# Patient Record
Sex: Female | Born: 1985 | Race: Black or African American | Hispanic: No | Smoking: Never smoker
Health system: Southern US, Community
[De-identification: ages and names within clinical notes are randomized; demographics above are authoritative.]

## PROBLEM LIST (undated history)

## (undated) DIAGNOSIS — D649 Anemia, unspecified: Secondary | ICD-10-CM

## (undated) DIAGNOSIS — J45909 Unspecified asthma, uncomplicated: Secondary | ICD-10-CM

## (undated) DIAGNOSIS — E559 Vitamin D deficiency, unspecified: Secondary | ICD-10-CM

## (undated) DIAGNOSIS — I10 Essential (primary) hypertension: Secondary | ICD-10-CM

## (undated) DIAGNOSIS — F32A Depression, unspecified: Secondary | ICD-10-CM

## (undated) HISTORY — PX: TUBAL LIGATION: SHX77

## (undated) HISTORY — PX: CHOLECYSTECTOMY: SHX55

## (undated) HISTORY — DX: Essential (primary) hypertension: I10

## (undated) HISTORY — DX: Anemia, unspecified: D64.9

## (undated) HISTORY — DX: Depression, unspecified: F32.A

## (undated) HISTORY — DX: Vitamin D deficiency, unspecified: E55.9

---

## 2018-01-14 ENCOUNTER — Other Ambulatory Visit: Payer: Self-pay

## 2018-01-14 ENCOUNTER — Encounter (HOSPITAL_COMMUNITY): Payer: Self-pay

## 2018-01-14 ENCOUNTER — Emergency Department (HOSPITAL_COMMUNITY)
Admission: EM | Admit: 2018-01-14 | Discharge: 2018-01-14 | Disposition: A | Discharge status: Home / Self Care | Payer: BLUE CROSS/BLUE SHIELD | Attending: Emergency Medicine | Admitting: Emergency Medicine

## 2018-01-14 DIAGNOSIS — T754XXA Electrocution, initial encounter: Secondary | ICD-10-CM | POA: Insufficient documentation

## 2018-01-14 DIAGNOSIS — J45909 Unspecified asthma, uncomplicated: Secondary | ICD-10-CM | POA: Diagnosis not present

## 2018-01-14 HISTORY — DX: Unspecified asthma, uncomplicated: J45.909

## 2018-01-14 LAB — CBC WITH DIFFERENTIAL/PLATELET
BASOS ABS: 0 10*3/uL (ref 0.0–0.1)
Basophils Relative: 0 %
EOS ABS: 0.2 10*3/uL (ref 0.0–0.7)
EOS PCT: 2 %
HCT: 36.7 % (ref 36.0–46.0)
Hemoglobin: 11.7 g/dL — ABNORMAL LOW (ref 12.0–15.0)
LYMPHS PCT: 25 %
Lymphs Abs: 2.3 10*3/uL (ref 0.7–4.0)
MCH: 26.8 pg (ref 26.0–34.0)
MCHC: 31.9 g/dL (ref 30.0–36.0)
MCV: 84 fL (ref 78.0–100.0)
MONO ABS: 0.5 10*3/uL (ref 0.1–1.0)
Monocytes Relative: 5 %
Neutro Abs: 6.2 10*3/uL (ref 1.7–7.7)
Neutrophils Relative %: 68 %
Platelets: 254 10*3/uL (ref 150–400)
RBC: 4.37 MIL/uL (ref 3.87–5.11)
RDW: 13.2 % (ref 11.5–15.5)
WBC: 9.2 10*3/uL (ref 4.0–10.5)

## 2018-01-14 LAB — COMPREHENSIVE METABOLIC PANEL
ALT: 19 U/L (ref 14–54)
AST: 21 U/L (ref 15–41)
Albumin: 3.6 g/dL (ref 3.5–5.0)
Alkaline Phosphatase: 74 U/L (ref 38–126)
Anion gap: 11 (ref 5–15)
BILIRUBIN TOTAL: 1 mg/dL (ref 0.3–1.2)
BUN: 10 mg/dL (ref 6–20)
CO2: 20 mmol/L — ABNORMAL LOW (ref 22–32)
CREATININE: 0.67 mg/dL (ref 0.44–1.00)
Calcium: 8.8 mg/dL — ABNORMAL LOW (ref 8.9–10.3)
Chloride: 105 mmol/L (ref 101–111)
GFR calc Af Amer: >60 mL/min (ref >60)
GLUCOSE: 194 mg/dL — AB (ref 65–99)
Potassium: 3.5 mmol/L (ref 3.5–5.1)
SODIUM: 136 mmol/L (ref 135–145)
TOTAL PROTEIN: 7.2 g/dL (ref 6.5–8.1)

## 2018-01-14 LAB — CK: CK TOTAL: 160 U/L (ref 38–234)

## 2018-01-14 LAB — I-STAT BETA HCG BLOOD, ED (MC, WL, AP ONLY)
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
## 2018-01-14 NOTE — Discharge Instructions (Addendum)
Please read attached information. If you experience any new or worsening signs or symptoms please return to the emergency room for evaluation. Please follow-up with your primary care provider or specialist as discussed.  °
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
## 2018-01-14 NOTE — ED Notes (Signed)
ED Provider at bedside. 

## 2018-03-18 ENCOUNTER — Emergency Department (HOSPITAL_COMMUNITY): Payer: BLUE CROSS/BLUE SHIELD

## 2018-03-18 ENCOUNTER — Emergency Department (HOSPITAL_COMMUNITY)
Admission: EM | Admit: 2018-03-18 | Discharge: 2018-03-18 | Disposition: A | Discharge status: Home / Self Care | Payer: BLUE CROSS/BLUE SHIELD | Attending: Emergency Medicine | Admitting: Emergency Medicine

## 2018-03-18 ENCOUNTER — Encounter (HOSPITAL_COMMUNITY): Payer: Self-pay | Admitting: *Deleted

## 2018-03-18 DIAGNOSIS — R197 Diarrhea, unspecified: Secondary | ICD-10-CM | POA: Diagnosis not present

## 2018-03-18 DIAGNOSIS — R112 Nausea with vomiting, unspecified: Secondary | ICD-10-CM | POA: Diagnosis not present

## 2018-03-18 DIAGNOSIS — R1110 Vomiting, unspecified: Secondary | ICD-10-CM | POA: Diagnosis not present

## 2018-03-18 DIAGNOSIS — R109 Unspecified abdominal pain: Secondary | ICD-10-CM | POA: Diagnosis not present

## 2018-03-18 DIAGNOSIS — R0602 Shortness of breath: Secondary | ICD-10-CM | POA: Diagnosis not present

## 2018-03-18 DIAGNOSIS — J45909 Unspecified asthma, uncomplicated: Secondary | ICD-10-CM | POA: Insufficient documentation

## 2018-03-18 DIAGNOSIS — K529 Noninfective gastroenteritis and colitis, unspecified: Secondary | ICD-10-CM | POA: Insufficient documentation

## 2018-03-18 DIAGNOSIS — R1010 Upper abdominal pain, unspecified: Secondary | ICD-10-CM | POA: Diagnosis not present

## 2018-03-18 LAB — LIPASE, BLOOD: Lipase: 25 U/L (ref 11–51)

## 2018-03-18 LAB — HEPATIC FUNCTION PANEL
ALK PHOS: 65 U/L (ref 38–126)
ALT: 52 U/L — ABNORMAL HIGH (ref 0–44)
AST: 105 U/L — ABNORMAL HIGH (ref 15–41)
Albumin: 3.6 g/dL (ref 3.5–5.0)
BILIRUBIN INDIRECT: 0.7 mg/dL (ref 0.3–0.9)
BILIRUBIN TOTAL: 0.9 mg/dL (ref 0.3–1.2)
Bilirubin, Direct: 0.2 mg/dL (ref 0.0–0.2)
Total Protein: 7.2 g/dL (ref 6.5–8.1)

## 2018-03-18 LAB — CBC
HCT: 37.6 % (ref 36.0–46.0)
Hemoglobin: 11.9 g/dL — ABNORMAL LOW (ref 12.0–15.0)
MCH: 27 pg (ref 26.0–34.0)
MCHC: 31.6 g/dL (ref 30.0–36.0)
MCV: 85.3 fL (ref 78.0–100.0)
Platelets: 248 10*3/uL (ref 150–400)
RBC: 4.41 MIL/uL (ref 3.87–5.11)
RDW: 12.8 % (ref 11.5–15.5)
WBC: 11.1 10*3/uL — ABNORMAL HIGH (ref 4.0–10.5)

## 2018-03-18 LAB — BASIC METABOLIC PANEL
Anion gap: 13 (ref 5–15)
BUN: 9 mg/dL (ref 6–20)
CALCIUM: 9 mg/dL (ref 8.9–10.3)
CHLORIDE: 103 mmol/L (ref 98–111)
CO2: 20 mmol/L — AB (ref 22–32)
CREATININE: 0.73 mg/dL (ref 0.44–1.00)
GFR calc Af Amer: >60 mL/min (ref >60)
GFR calc non Af Amer: >60 mL/min (ref >60)
GLUCOSE: 154 mg/dL — AB (ref 70–99)
Potassium: 3 mmol/L — ABNORMAL LOW (ref 3.5–5.1)
Sodium: 136 mmol/L (ref 135–145)

## 2018-03-18 LAB — URINALYSIS, ROUTINE W REFLEX MICROSCOPIC
Bilirubin Urine: NEGATIVE
GLUCOSE, UA: NEGATIVE mg/dL
HGB URINE DIPSTICK: NEGATIVE
Ketones, ur: 15 mg/dL — AB
Leukocytes, UA: NEGATIVE
Nitrite: NEGATIVE
PH: 7.5 (ref 5.0–8.0)
Protein, ur: NEGATIVE mg/dL

## 2018-03-18 LAB — I-STAT BETA HCG BLOOD, ED (MC, WL, AP ONLY): I-stat hCG, quantitative: <5 m[IU]/mL (ref <5)

## 2018-03-18 LAB — I-STAT TROPONIN, ED: TROPONIN I, POC: 0 ng/mL (ref 0.00–0.08)

## 2018-03-18 MED ORDER — SODIUM CHLORIDE 0.9 % IV BOLUS
1000.0000 mL | Freq: Once | INTRAVENOUS | Status: AC
  Administered 2018-03-18: 1000 mL via INTRAVENOUS

## 2018-03-18 MED ORDER — SODIUM CHLORIDE 0.9 % IV SOLN
INTRAVENOUS | Status: DC
  Administered 2018-03-18: 10:00:00 via INTRAVENOUS

## 2018-03-18 MED ORDER — PROMETHAZINE HCL 25 MG PO TABS: 25.0000 mg | ORAL_TABLET | Freq: Four times a day (QID) | ORAL | 1 refills | Status: DC | PRN

## 2018-03-18 MED ORDER — IOHEXOL 300 MG/ML  SOLN
100.0000 mL | Freq: Once | INTRAMUSCULAR | Status: AC
  Administered 2018-03-18: 100 mL via INTRAVENOUS

## 2018-03-18 MED ORDER — LOPERAMIDE HCL 2 MG PO TABS: 2.0000 mg | ORAL_TABLET | Freq: Four times a day (QID) | ORAL | 0 refills | Status: DC | PRN

## 2018-03-18 MED ORDER — HYDROMORPHONE HCL 1 MG/ML IJ SOLN: 1.0000 mg | Freq: Once | INTRAMUSCULAR | Status: DC

## 2018-03-18 MED ORDER — HYDROMORPHONE HCL 1 MG/ML IJ SOLN
0.5000 mg | Freq: Once | INTRAMUSCULAR | Status: AC
  Administered 2018-03-18: 0.5 mg via INTRAVENOUS
  Filled 2018-03-18: qty 1

## 2018-03-18 MED ORDER — ONDANSETRON HCL 4 MG/2ML IJ SOLN
4.0000 mg | Freq: Once | INTRAMUSCULAR | Status: AC
  Administered 2018-03-18: 4 mg via INTRAVENOUS
  Filled 2018-03-18: qty 2

## 2018-03-18 NOTE — ED Triage Notes (Signed)
Pt in stating she woke up with vomiting and diarrhea, reports abdominal pain the last few days, as the morning went on developed shortness of breath, pt hyperventilating on arrival, speaking in short phrases

## 2018-03-18 NOTE — ED Notes (Signed)
Patient transported to CT 

## 2018-03-18 NOTE — Discharge Instructions (Addendum)
Take the Phenergan as needed for nausea and vomiting.  Take the Imodium as needed for diarrhea.  Rest small sips of fluid frequently then bland diet.  CT scan of abdomen without any acute findings.  Return for any new or worse symptoms.

## 2018-03-18 NOTE — ED Provider Notes (Signed)
[facility] EMERGENCY DEPARTMENT Provider Note   CSN: [id number] Arrival date & time: 03/18/18  0841     History   Chief Complaint Chief Complaint  Patient presents with  . Emesis  . Shortness of Breath  . Abdominal Pain    HPI Tina Mayer is a 32 y.o. female.  Patient awoke this morning with nausea vomiting and diarrhea.  But no blood in it.  Also associated with abdominal pain mostly in the upper part of the abdomen.  Patient did arrive with some hyperventilation feeling short of breath.  When I saw her her breathing was normal.  But she was talking very softly.  Oxygen saturations were fine they were 100%.  Past medical history of asthma.  Patient did not feel as if she was wheezing.  Patient states she felt fine yesterday.  Triage reported that she had some abdominal pain for the past few days I did not get that from her on my history.  In addition patient denies any chest pain.     Past Medical History:  Diagnosis Date  . Asthma     There are no active problems to display for this patient.   Past Surgical History:  Procedure Laterality Date  . CHOLECYSTECTOMY    . TUBAL LIGATION       OB History   None      Home Medications    Prior to Admission medications   Medication Sig Start Date End Date Taking? Authorizing Provider  clindamycin (CLEOCIN) 150 MG capsule Take 300 mg by mouth 4 (four) times daily.   Yes [provider]  loperamide (IMODIUM A-D) 2 MG tablet Take 1 tablet (2 mg total) by mouth 4 (four) times daily as needed for diarrhea or loose stools. 03/18/18   Vanetta Mulders, MD  promethazine (PHENERGAN) 25 MG tablet Take 1 tablet (25 mg total) by mouth every 6 (six) hours as needed for nausea or vomiting. 03/18/18   Vanetta Mulders, MD    Family History History reviewed. No pertinent family history.  Social History Social History   Tobacco Use  . Smoking status: Never Smoker  Substance Use Topics  .  Alcohol use: Yes    Comment: occ  . Drug use: Never     Allergies   Penicillins   Review of Systems Review of Systems  Constitutional: Negative for fever.  HENT: Negative for congestion.   Eyes: Negative for visual disturbance.  Respiratory: Positive for shortness of breath.   Cardiovascular: Negative for chest pain.  Gastrointestinal: Positive for abdominal pain, diarrhea, nausea and vomiting.  Genitourinary: Negative for dysuria and hematuria.  Musculoskeletal: Negative for back pain.  Skin: Negative for rash.  Neurological: Negative for syncope and headaches.  Hematological: Does not bruise/bleed easily.  Psychiatric/Behavioral: Negative for confusion.     Physical Exam Updated Vital Signs BP 140/83   Pulse 86   Temp 98.9 F (37.2 C) (Oral)   Resp 19   SpO2 98%   Physical Exam  Constitutional: She is oriented to person, place, and time. She appears well-developed and well-nourished. No distress.  HENT:  Head: Normocephalic and atraumatic.  Mouth/Throat: Oropharynx is clear and moist.  Eyes: Pupils are equal, round, and reactive to light. EOM are normal.  Neck: Neck supple.  Cardiovascular: Normal rate, regular rhythm and normal heart sounds.  Pulmonary/Chest: Effort normal and breath sounds normal. No respiratory distress. She has no wheezes.  Abdominal: Soft. Bowel sounds are normal. There is no tenderness.  Musculoskeletal: Normal range of motion. She exhibits no edema.  Neurological: She is alert and oriented to person, place, and time. No cranial nerve deficit or sensory deficit. She exhibits normal muscle tone. Coordination normal.  Skin: Skin is warm. Capillary refill takes less than 2 seconds.  Nursing note and vitals reviewed.    ED Treatments / Results  Labs (all labs ordered are listed, but only abnormal results are displayed) Labs Reviewed  BASIC METABOLIC PANEL - Abnormal; Notable for the following components:      Result Value   Potassium 3.0  (*)    CO2 20 (*)    Glucose, Bld 154 (*)    All other components within normal limits  CBC - Abnormal; Notable for the following components:   WBC 11.1 (*)    Hemoglobin 11.9 (*)    All other components within normal limits  URINALYSIS, ROUTINE W REFLEX MICROSCOPIC - Abnormal; Notable for the following components:   APPearance HAZY (*)    Specific Gravity, Urine <1.005 (*)    Ketones, ur 15 (*)    All other components within normal limits  HEPATIC FUNCTION PANEL - Abnormal; Notable for the following components:   AST 105 (*)    ALT 52 (*)    All other components within normal limits  LIPASE, BLOOD  I-STAT TROPONIN, ED  I-STAT BETA HCG BLOOD, ED ([facility], [facility], [facility] ONLY)    EKG EKG Interpretation  Date/Time:  Thursday March 18 2018 08:51:24 EDT Ventricular Rate:  85 PR Interval:    QRS Duration: 100 QT Interval:  386 QTC Calculation: 459 R Axis:   59 Text Interpretation:  Sinus rhythm Baseline wander in lead(s) I III aVL aVF Confirmed by Vanetta MuldersZackowski, Alechia Lezama [phone]([id number]) on 03/18/2018 8:56:22 AM   Radiology Dg Chest 2 View  Result Date: 03/18/2018 CLINICAL DATA:  Shortness of breath. EXAM: CHEST - 2 VIEW COMPARISON:  None. FINDINGS: The cardiac silhouette is upper limits of normal in size. The lungs are mildly hypoinflated with bronchovascular crowding and slight accentuation of the interstitial markings but no overt edema, segmental consolidation, pleural effusion, or pneumothorax. No acute osseous abnormality is seen. Right upper quadrant abdominal surgical clips are noted. IMPRESSION: Mild hypoinflation without evidence of acute airspace disease. Electronically Signed   By: Sebastian AcheAllen  Grady M.D.   On: 03/18/2018 09:44   Ct Abdomen Pelvis W Contrast  Result Date: 03/18/2018 CLINICAL DATA:  Pt in stating she woke up with vomiting and diarrhea, reports abdominal pain the last few days, as the morning went on developed shortness of breath Hx asthma EXAM: CT ABDOMEN AND PELVIS WITH CONTRAST  TECHNIQUE: Multidetector CT imaging of the abdomen and pelvis was performed using the standard protocol following bolus administration of intravenous contrast. CONTRAST:  100mL OMNIPAQUE IOHEXOL 300 MG/ML  SOLN COMPARISON:  None. FINDINGS: Lower chest: No acute abnormality. Hepatobiliary: Probable focal fatty infiltration near the falciform ligament. No other focal liver abnormality is seen. Status post cholecystectomy. No biliary dilatation. Pancreas: Unremarkable. No pancreatic ductal dilatation or surrounding inflammatory changes. Spleen: Normal in size without focal abnormality. Adrenals/Urinary Tract: Adrenal glands are unremarkable. Kidneys are normal, without renal calculi, focal lesion, or hydronephrosis. Bladder is unremarkable. Stomach/Bowel: Stomach and small bowel nondilated. Normal appendix. The colon is nondilated, unremarkable. Vascular/Lymphatic: Minimal calcified plaque at the aortic bifurcation. Bilateral pelvic phleboliths. No abdominal or pelvic adenopathy. Reproductive: Uterus and bilateral adnexa are unremarkable. Other: No ascites.  No free air. Musculoskeletal: Small umbilical hernia containing only mesenteric fat. Negative for fracture  or worrisome bone lesion. IMPRESSION: Negative Electronically Signed   By: Corlis Leak M.D.   On: 03/18/2018 12:44    Procedures Procedures (including critical care time)  Medications Ordered in ED Medications  0.9 %  sodium chloride infusion ( Intravenous New Bag/Given 03/18/18 1004)  sodium chloride 0.9 % bolus 1,000 mL (0 mLs Intravenous Stopped 03/18/18 1243)  ondansetron (ZOFRAN) injection 4 mg (4 mg Intravenous Given 03/18/18 1005)  HYDROmorphone (DILAUDID) injection 0.5 mg (0.5 mg Intravenous Given 03/18/18 1005)  iohexol (OMNIPAQUE) 300 MG/ML solution 100 mL (100 mLs Intravenous Contrast Given 03/18/18 1212)     Initial Impression / Assessment and Plan / ED Course  I have reviewed the triage vital signs and the nursing notes.  Pertinent  labs & imaging results that were available during my care of the patient were reviewed by me and considered in my medical decision making (see chart for details).    Patient symptoms seem to be consistent with a viral gastroenteritis.  Some question whether abdominal pain just started this morning or whether was present for a few days either way CT scan of abdomen without any acute findings.  Labs without any significant abnormalities.  Patient improved here with IV fluids pain medicine and antinausea medicine.  Patient be treated symptomatically.  Work note provided.  Patient shortness of breath resolved completely without any specific treatment for it.   Final Clinical Impressions(s) / ED Diagnoses   Final diagnoses:  Nausea vomiting and diarrhea  Gastroenteritis    ED Discharge Orders        Ordered    promethazine (PHENERGAN) 25 MG tablet  Every 6 hours PRN     03/18/18 1353    loperamide (IMODIUM A-D) 2 MG tablet  4 times daily PRN     03/18/18 1353       Vanetta Mulders, MD 03/18/18 1403

## 2018-03-18 NOTE — ED Notes (Signed)
Patient transported to X-ray 

## 2018-06-22 ENCOUNTER — Other Ambulatory Visit (HOSPITAL_COMMUNITY)
Admission: RE | Admit: 2018-06-22 | Discharge: 2018-06-22 | Disposition: A | Discharge status: Home / Self Care | Payer: BLUE CROSS/BLUE SHIELD | Source: Ambulatory Visit | Attending: Nurse Practitioner | Admitting: Nurse Practitioner

## 2018-06-22 ENCOUNTER — Other Ambulatory Visit: Payer: Self-pay | Admitting: Nurse Practitioner

## 2018-06-22 DIAGNOSIS — Z01419 Encounter for gynecological examination (general) (routine) without abnormal findings: Secondary | ICD-10-CM | POA: Diagnosis not present

## 2018-06-22 DIAGNOSIS — B373 Candidiasis of vulva and vagina: Secondary | ICD-10-CM | POA: Diagnosis not present

## 2018-06-22 DIAGNOSIS — N898 Other specified noninflammatory disorders of vagina: Secondary | ICD-10-CM | POA: Diagnosis not present

## 2018-06-23 LAB — CYTOLOGY - PAP
Diagnosis: NEGATIVE
HPV (WINDOPATH): NOT DETECTED

## 2019-12-24 DIAGNOSIS — N939 Abnormal uterine and vaginal bleeding, unspecified: Secondary | ICD-10-CM | POA: Diagnosis not present

## 2019-12-24 DIAGNOSIS — A5901 Trichomonal vulvovaginitis: Secondary | ICD-10-CM | POA: Diagnosis not present

## 2019-12-24 DIAGNOSIS — Z113 Encounter for screening for infections with a predominantly sexual mode of transmission: Secondary | ICD-10-CM | POA: Diagnosis not present

## 2019-12-28 DIAGNOSIS — M25562 Pain in left knee: Secondary | ICD-10-CM | POA: Diagnosis not present

## 2020-02-09 DIAGNOSIS — A599 Trichomoniasis, unspecified: Secondary | ICD-10-CM | POA: Diagnosis not present

## 2020-02-09 DIAGNOSIS — B373 Candidiasis of vulva and vagina: Secondary | ICD-10-CM | POA: Diagnosis not present

## 2020-06-12 DIAGNOSIS — U071 COVID-19: Secondary | ICD-10-CM | POA: Diagnosis not present

## 2020-06-12 DIAGNOSIS — Z20822 Contact with and (suspected) exposure to covid-19: Secondary | ICD-10-CM | POA: Diagnosis not present

## 2021-05-19 DIAGNOSIS — Z20822 Contact with and (suspected) exposure to covid-19: Secondary | ICD-10-CM | POA: Diagnosis not present

## 2021-06-02 ENCOUNTER — Encounter (HOSPITAL_COMMUNITY): Payer: Self-pay | Admitting: Emergency Medicine

## 2021-06-02 ENCOUNTER — Emergency Department (HOSPITAL_COMMUNITY): Payer: BC Managed Care – PPO

## 2021-06-02 ENCOUNTER — Other Ambulatory Visit: Payer: Self-pay

## 2021-06-02 ENCOUNTER — Emergency Department (HOSPITAL_COMMUNITY)
Admission: EM | Admit: 2021-06-02 | Discharge: 2021-06-02 | Disposition: A | Discharge status: Home / Self Care | Payer: BC Managed Care – PPO | Attending: Emergency Medicine | Admitting: Emergency Medicine

## 2021-06-02 DIAGNOSIS — M2578 Osteophyte, vertebrae: Secondary | ICD-10-CM | POA: Diagnosis not present

## 2021-06-02 DIAGNOSIS — J45909 Unspecified asthma, uncomplicated: Secondary | ICD-10-CM | POA: Diagnosis not present

## 2021-06-02 DIAGNOSIS — M25512 Pain in left shoulder: Secondary | ICD-10-CM | POA: Diagnosis not present

## 2021-06-02 DIAGNOSIS — J352 Hypertrophy of adenoids: Secondary | ICD-10-CM | POA: Diagnosis not present

## 2021-06-02 DIAGNOSIS — R791 Abnormal coagulation profile: Secondary | ICD-10-CM | POA: Diagnosis not present

## 2021-06-02 DIAGNOSIS — M79602 Pain in left arm: Secondary | ICD-10-CM

## 2021-06-02 DIAGNOSIS — R29898 Other symptoms and signs involving the musculoskeletal system: Secondary | ICD-10-CM

## 2021-06-02 DIAGNOSIS — R519 Headache, unspecified: Secondary | ICD-10-CM | POA: Diagnosis not present

## 2021-06-02 DIAGNOSIS — M5412 Radiculopathy, cervical region: Secondary | ICD-10-CM

## 2021-06-02 DIAGNOSIS — R531 Weakness: Secondary | ICD-10-CM | POA: Diagnosis not present

## 2021-06-02 DIAGNOSIS — G379 Demyelinating disease of central nervous system, unspecified: Secondary | ICD-10-CM | POA: Diagnosis not present

## 2021-06-02 DIAGNOSIS — J019 Acute sinusitis, unspecified: Secondary | ICD-10-CM | POA: Diagnosis not present

## 2021-06-02 DIAGNOSIS — R202 Paresthesia of skin: Secondary | ICD-10-CM | POA: Insufficient documentation

## 2021-06-02 LAB — COMPREHENSIVE METABOLIC PANEL
ALT: 20 U/L (ref 0–44)
AST: 21 U/L (ref 15–41)
Albumin: 3.6 g/dL (ref 3.5–5.0)
Alkaline Phosphatase: 70 U/L (ref 38–126)
Anion gap: 10 (ref 5–15)
BUN: 12 mg/dL (ref 6–20)
CO2: 21 mmol/L — ABNORMAL LOW (ref 22–32)
Calcium: 8.6 mg/dL — ABNORMAL LOW (ref 8.9–10.3)
Chloride: 106 mmol/L (ref 98–111)
Creatinine, Ser: 0.64 mg/dL (ref 0.44–1.00)
GFR, Estimated: >60 mL/min (ref >60)
Glucose, Bld: 122 mg/dL — ABNORMAL HIGH (ref 70–99)
Potassium: 3.5 mmol/L (ref 3.5–5.1)
Sodium: 137 mmol/L (ref 135–145)
Total Bilirubin: 0.5 mg/dL (ref 0.3–1.2)
Total Protein: 7.4 g/dL (ref 6.5–8.1)

## 2021-06-02 LAB — PROTIME-INR
INR: 1.1 (ref 0.8–1.2)
Prothrombin Time: 14 seconds (ref 11.4–15.2)

## 2021-06-02 LAB — CBC
HCT: 36.5 % (ref 36.0–46.0)
Hemoglobin: 11.6 g/dL — ABNORMAL LOW (ref 12.0–15.0)
MCH: 27 pg (ref 26.0–34.0)
MCHC: 31.8 g/dL (ref 30.0–36.0)
MCV: 85.1 fL (ref 80.0–100.0)
Platelets: 287 10*3/uL (ref 150–400)
RBC: 4.29 MIL/uL (ref 3.87–5.11)
RDW: 12.8 % (ref 11.5–15.5)
WBC: 11.8 10*3/uL — ABNORMAL HIGH (ref 4.0–10.5)
nRBC: 0 % (ref 0.0–0.2)

## 2021-06-02 LAB — DIFFERENTIAL
Abs Immature Granulocytes: 0.05 10*3/uL (ref 0.00–0.07)
Basophils Absolute: 0 10*3/uL (ref 0.0–0.1)
Basophils Relative: 0 %
Eosinophils Absolute: 0.1 10*3/uL (ref 0.0–0.5)
Eosinophils Relative: 1 %
Immature Granulocytes: 0 %
Lymphocytes Relative: 24 %
Lymphs Abs: 2.8 10*3/uL (ref 0.7–4.0)
Monocytes Absolute: 1 10*3/uL (ref 0.1–1.0)
Monocytes Relative: 8 %
Neutro Abs: 7.9 10*3/uL — ABNORMAL HIGH (ref 1.7–7.7)
Neutrophils Relative %: 67 %

## 2021-06-02 LAB — C-REACTIVE PROTEIN: CRP: <0.5 mg/dL (ref <1.0)

## 2021-06-02 LAB — I-STAT BETA HCG BLOOD, ED (MC, WL, AP ONLY): I-stat hCG, quantitative: <5 m[IU]/mL (ref <5)

## 2021-06-02 LAB — CK: Total CK: 226 U/L (ref 38–234)

## 2021-06-02 LAB — SEDIMENTATION RATE: Sed Rate: 20 mm/hr (ref 0–22)

## 2021-06-02 LAB — APTT: aPTT: 29 seconds (ref 24–36)

## 2021-06-02 MED ORDER — GADOBUTROL 1 MMOL/ML IV SOLN
10.0000 mL | Freq: Once | INTRAVENOUS | Status: AC | PRN
  Administered 2021-06-02: 10 mL via INTRAVENOUS

## 2021-06-02 MED ORDER — SODIUM CHLORIDE 0.9% FLUSH
3.0000 mL | Freq: Once | INTRAVENOUS | Status: DC
Start: 2021-06-02 — End: 2021-06-03

## 2021-06-02 MED ORDER — IBUPROFEN 600 MG PO TABS: 600.0000 mg | ORAL_TABLET | Freq: Three times a day (TID) | ORAL | 0 refills | Status: AC

## 2021-06-02 MED ORDER — METOCLOPRAMIDE HCL 5 MG/ML IJ SOLN
10.0000 mg | Freq: Once | INTRAMUSCULAR | Status: AC
  Administered 2021-06-02: 10 mg via INTRAVENOUS
  Filled 2021-06-02: qty 2

## 2021-06-02 MED ORDER — IBUPROFEN 600 MG PO TABS: 600.0000 mg | ORAL_TABLET | Freq: Three times a day (TID) | ORAL | 0 refills | Status: DC

## 2021-06-02 MED ORDER — PREDNISONE 10 MG (21) PO TBPK: ORAL_TABLET | Freq: Every day | ORAL | 0 refills | Status: DC

## 2021-06-02 MED ORDER — DIPHENHYDRAMINE HCL 25 MG PO CAPS
25.0000 mg | ORAL_CAPSULE | Freq: Once | ORAL | Status: AC
  Administered 2021-06-02: 25 mg via ORAL
  Filled 2021-06-02: qty 1

## 2021-06-02 NOTE — Discharge Instructions (Addendum)
I am prescribing you a strong steroid medication called prednisone.  Please only take this as prescribed.  Please take it early in the morning, as this medication can be stimulating and make it difficult to sleep at night.  I am also prescribing you a anti-inflammatory medication called ibuprofen.  Please take this 3 times per day for the next 2 weeks.  I would recommend taking with a small amount of food to help prevent stomach irritation.  Below is the contact information for Dr. Allena Katz.  I put in a referral so they should be reaching out to you but I would also recommend calling their office and scheduling an appointment for reevaluation.  If you develop any new or worsening symptoms please come back to the emergency department immediately for reevaluation.  It was a pleasure to meet you.

## 2021-06-02 NOTE — ED Provider Notes (Signed)
Patient is a 35 year old female whose care was transferred to me by Kellie Moor.  His HPI is below:  Patient is a 35 year old female with no pertinent past medical history seen today to the ER with left arm weakness and some decreased sensation.  She states that she went to bed at 1:30 AM this morning after having 1 margarita no recreational drugs states that she has no history of recreational drugs or alcoholism  She states that she woke up at 3 AM with left arm weakness and some decrease sensation.  She states she has had no blurry vision double vision or visual field deficits.  She denies any slurred speech or confusion no head injuries neck injury or neck pain or headache.  On repeat questioning it seems that she is having some mild headache but states is barely noticeable.  She has a family history of MS on her mother side   She has no significant medical issues that she is aware of.  Denies any back pain lower extremity weakness or numbness any difficulty walking denies any cocaine use.   No history of similar abscess.  No other associate symptoms.  No aggravating mitigating factors.  Her symptoms been consistent since 3 AM she states her last known normal was 1:30 AM.  Physical Exam  BP (!) 155/103 (BP Location: Right Arm)   Pulse 86   Temp 98.5 F (36.9 C) (Oral)   Resp 18   LMP 05/24/2021   SpO2 100%   Physical Exam Vitals and nursing note reviewed.  Constitutional:      General: She is not in acute distress.    Appearance: Normal appearance. She is not ill-appearing, toxic-appearing or diaphoretic.  HENT:     Head: Normocephalic and atraumatic.     Right Ear: External ear normal.     Left Ear: External ear normal.     Nose: Nose normal.     Mouth/Throat:     Mouth: Mucous membranes are moist.     Pharynx: Oropharynx is clear. No oropharyngeal exudate or posterior oropharyngeal erythema.  Eyes:     Extraocular Movements: Extraocular movements intact.  Cardiovascular:      Rate and Rhythm: Normal rate and regular rhythm.     Pulses: Normal pulses.     Heart sounds: Normal heart sounds. No murmur heard.   No friction rub. No gallop.  Pulmonary:     Effort: Pulmonary effort is normal. No respiratory distress.     Breath sounds: Normal breath sounds. No stridor. No wheezing, rhonchi or rales.  Abdominal:     General: Abdomen is flat.     Tenderness: There is no abdominal tenderness.  Musculoskeletal:        General: Normal range of motion.     Cervical back: Normal range of motion and neck supple. No tenderness.  Skin:    General: Skin is warm and dry.  Neurological:     Mental Status: She is alert and oriented to person, place, and time.     Comments: Left upper extremity: Distal sensation intact.  2+ radial pulses.  Grip strength appears intact.  Patient unable to lift the left arm at the shoulder or bend at the elbow.  Speaking clearly, coherently, and in complete sentences.  A&O x3.  No deficits appreciated in the right arm or bilateral lower extremities.  Ambulatory with steady gait.  Psychiatric:        Mood and Affect: Mood normal.  Behavior: Behavior normal.    ED Course/Procedures   Clinical Course as of 06/02/21 2027  Sun Jun 02, 2021  1452 Discussed with Dr. Otelia Limes of neurology who recommended MRI C-spine and brain with and without contrast [WF]  1842 Patient discussed with Dr. Otelia Limes.  States neurology will need to evaluate the patient. [LJ]    Clinical Course User Index [LJ] Placido Sou, PA-C [WF] Gailen Shelter, [place]    Procedures  MDM  Patient is a 35 year old female whose care was transferred me from prior PA-C.  Please see his note below for further information.  In summary, patient states that she had 1 margarita last night and went to bed.  She woke up this morning with left arm weakness and decreased sensation.  No blurry vision, double vision, visual field deficits, slurred speech, confusion, recent  injuries.  At shift change lab work and CT scan of the head was mostly reassuring.  Previous provider discussed patient with Dr. Otelia Limes who recommends MRIs of the head and neck with contrast which are pending at this time.  MRI of the brain with contrast shows no evidence of acute intracranial abnormalities.  MRI of the cervical spine with contrast with findings as noted below: 1. Motion limited evaluation of the cord. Question subtle T2  hyperintensity within the right lateral cord at C3-C4 and possible  subtle cord signal hyperintensity at C5-C6 versus artifact. No  abnormal cord enhancement.  2. Small posterior disc osteophyte complex at C6-C7 contacts the  left ventral cord without significant canal or foraminal stenosis.   Patient discussed with Dr. Otelia Limes once again who recommends neurology evaluation prior to discharge.  Patient evaluated by Dr. Wilford Corner.  Unsure of the source of her symptoms.  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  Patient already given IV contrast for previous MRI so she will not be able to receive an additional contrast bolus this evening.  Recommends we discharge on prednisone as well as NSAIDs and have her follow-up outpatient with Dr. Nita Sickle with [facility].  Ambulatory referral was placed.  Also obtain an ANA, ANCA, RF, and CRP prior to her discharge.  Per neurology, patient will likely require an MRI of the left shoulder with contrast focusing on the brachial plexus as well as an EMG.  Patient verbalized understanding of the above plan.  Feel that she is stable for discharge at this time and she is agreeable.  Her questions were answered and she was amicable at the time of discharge.       Placido Sou, PA-C 06/02/21 2031    Terald Sleeper, MD 06/02/21 [phone]

## 2021-06-02 NOTE — ED Notes (Signed)
Patient transported to X-ray 

## 2021-06-02 NOTE — ED Triage Notes (Signed)
Pt went to bed at 1:30am and woke up at 3am with L arm weakness and numbness.  Speech clear.  L arm drift.  VAN negative.

## 2021-06-02 NOTE — ED Notes (Signed)
Patient transported to MRI 

## 2021-06-02 NOTE — ED Provider Notes (Signed)
[facility] EMERGENCY DEPARTMENT Provider Note   CSN: [id number] Arrival date & time: 06/02/21  1150     History No chief complaint on file.   Tina Mayer is a 35 y.o. female.  HPI Patient is a 35 year old female with no pertinent past medical history seen today to the ER with left arm weakness and some decreased sensation.  She states that she went to bed at 1:30 AM this morning after having 1 margarita no recreational drugs states that she has no history of recreational drugs or alcoholism  She states that she woke up at 3 AM with left arm weakness and some decrease sensation.  She states she has had no blurry vision double vision or visual field deficits.  She denies any slurred speech or confusion no head injuries neck injury or neck pain or headache.  On repeat questioning it seems that she is having some mild headache but states is barely noticeable.  She has a family history of MS on her mother side  She has no significant medical issues that she is aware of.  Denies any back pain lower extremity weakness or numbness any difficulty walking denies any cocaine use.  No history of similar abscess.  No other associate symptoms.  No aggravating mitigating factors.  Her symptoms been consistent since 3 AM she states her last known normal was 1:30 AM.    Past Medical History:  Diagnosis Date   Asthma     There are no problems to display for this patient.   Past Surgical History:  Procedure Laterality Date   CHOLECYSTECTOMY     TUBAL LIGATION       OB History   No obstetric history on file.     No family history on file.  Social History   Tobacco Use   Smoking status: Never   Smokeless tobacco: Never  Substance Use Topics   Alcohol use: Yes    Comment: occ   Drug use: Never    Home Medications Prior to Admission medications   Medication Sig Start Date End Date Taking? Authorizing Provider  clindamycin (CLEOCIN) 150 MG  capsule Take 300 mg by mouth 4 (four) times daily.    [provider]  loperamide (IMODIUM A-D) 2 MG tablet Take 1 tablet (2 mg total) by mouth 4 (four) times daily as needed for diarrhea or loose stools. 03/18/18   Vanetta Mulders, MD  promethazine (PHENERGAN) 25 MG tablet Take 1 tablet (25 mg total) by mouth every 6 (six) hours as needed for nausea or vomiting. 03/18/18   Vanetta Mulders, MD    Allergies    Penicillins  Review of Systems   Review of Systems  Constitutional:  Negative for chills and fever.  HENT:  Negative for congestion.   Eyes:  Negative for pain.  Respiratory:  Negative for cough and shortness of breath.   Cardiovascular:  Negative for chest pain and leg swelling.  Gastrointestinal:  Negative for abdominal pain and vomiting.  Genitourinary:  Negative for dysuria.  Musculoskeletal:  Negative for myalgias.  Skin:  Negative for rash.  Neurological:  Positive for weakness and numbness. Negative for dizziness and headaches.   Physical Exam Updated Vital Signs BP (!) 155/103 (BP Location: Right Arm)   Pulse 86   Temp 98.5 F (36.9 C) (Oral)   Resp 18   LMP 05/24/2021   SpO2 100%   Physical Exam Neurological:     Comments: L arm pt is holding arm stiffly  at side Seems to be unable to abduct or flex at shoulder; seems unable to flex or extend elbow Good wrist movement and finger movement  Alert and oriented to self, place, time and event.   Speech is fluent, clear without dysarthria or dysphasia.   Strength 5/5 in upper/lower extremities   Sensation intact in upper/lower extremities   Normal gait.  Unable to move left arm therefore unable to do finger-to-nose CN I not tested  CN II grossly intact visual fields bilaterally. Did not visualize posterior eye.  CN III, IV, VI PERRLA and EOMs intact bilaterally  CN V Intact sensation to sharp and light touch to the face  CN VII facial movements symmetric  CN VIII not tested  CN IX, X no uvula  deviation, symmetric rise of soft palate  CN XI 5/5 SCM and trapezius strength bilaterally  CN XII Midline tongue protrusion, symmetric L/R movements       ED Results / Procedures / Treatments   Labs (all labs ordered are listed, but only abnormal results are displayed) Labs Reviewed  CBC - Abnormal; Notable for the following components:      Result Value   WBC 11.8 (*)    Hemoglobin 11.6 (*)    All other components within normal limits  DIFFERENTIAL - Abnormal; Notable for the following components:   Neutro Abs 7.9 (*)    All other components within normal limits  COMPREHENSIVE METABOLIC PANEL - Abnormal; Notable for the following components:   CO2 21 (*)    Glucose, Bld 122 (*)    Calcium 8.6 (*)    All other components within normal limits  PROTIME-INR  APTT  CK  I-STAT BETA HCG BLOOD, ED ([facility], [facility], [facility] ONLY)    EKG EKG Interpretation  Date/Time:  Sunday June 02 2021 12:00:22 EDT Ventricular Rate:  94 PR Interval:  146 QRS Duration: 84 QT Interval:  358 QTC Calculation: 447 R Axis:   46 Text Interpretation: Normal sinus rhythm Normal ECG Confirmed by Alvester Chou [phone]) on 06/02/2021 3:43:11 PM  Radiology CT HEAD WO CONTRAST  Result Date: 06/02/2021 CLINICAL DATA:  Left arm weakness and numbness.  Headaches. EXAM: CT HEAD WITHOUT CONTRAST TECHNIQUE: Contiguous axial images were obtained from the base of the skull through the vertex without intravenous contrast. COMPARISON:  None. FINDINGS: Brain: No evidence of acute infarction, hemorrhage, hydrocephalus, extra-axial collection or mass lesion/mass effect. Vascular: No hyperdense vessel or unexpected calcification. Skull: Normal. Negative for fracture or focal lesion. Sinuses/Orbits: No acute finding. Other: None IMPRESSION: No focal acute intracranial abnormality identified. Electronically Signed   By: Sherian Rein M.D.   On: 06/02/2021 13:08    Procedures Procedures   Medications Ordered in ED Medications   sodium chloride flush (NS) 0.9 % injection 3 mL (3 mLs Intravenous Not Given 06/02/21 1420)  metoCLOPramide (REGLAN) injection 10 mg (10 mg Intravenous Given 06/02/21 1519)  diphenhydrAMINE (BENADRYL) capsule 25 mg (25 mg Oral Given 06/02/21 1518)    ED Course  I have reviewed the triage vital signs and the nursing notes.  Pertinent labs & imaging results that were available during my care of the patient were reviewed by me and considered in my medical decision making (see chart for details).  Clinical Course as of 06/02/21 1601  Sun Jun 02, 2021  1452 Discussed with Dr. Otelia Limes of neurology who recommended MRI C-spine and brain with and without contrast [WF]    Clinical Course User Index [WF] Gailen Shelter, [place]  MDM Rules/Calculators/A&P                             I personally reviewed all laboratory work and imaging.  Metabolic panel without any acute abnormality specifically kidney function within normal limits and no significant electrolyte abnormalities. CBC without leukocytosis or significant anemia.  Coags within normal meds.  CK unremarkable.  CT head unremarkable EKG unremarkable.  MRI brain and MRI C-spine with and without contrast obtained considering functional versus MS versus stroke  Pt care signed out to oncoming team.  Will discuss with Dr. Otelia Limes if abnormal imaging otherwise follow-up with neurology.  Final Clinical Impression(s) / ED Diagnoses Final diagnoses:  None    Rx / DC Orders ED Discharge Orders     None        Gailen Shelter, [place] 06/02/21 1602    Cathren Laine, MD 06/03/21 1630

## 2021-06-02 NOTE — Consult Note (Signed)
Neurology Consultation  Reason for Consult: Leg pain and weakness Referring Physician: Dr. Alvester Chou  CC: Left shoulder pain, arm pain and left arm weakness  History is obtained from: Patient, chart  HPI: Tina Mayer is a 35 y.o. female no significant past medical history, presented to the emergency room for evaluation of left shoulder pain and left arm weakness.  She says she went to bed normally yesterday and night and woke up sometime around 3 AM today with significant pain in her left shoulder.  She also could not use her left arm due to weakness.  She describes that the pain is mostly in her left shoulder and in the lower part of the left neck.  The distal part of the left arm, forearm and the left hand has more tingly sensation rather than pain.  She is not able to bend her elbow with full strength.  She has never had any episodes of tingling numbness or weakness in the past.  No episodes of transient vision loss or vision loss for an extended period of time. Seen and evaluated in the emergency room.  C-spine and brain MRI with and without contrast performed.  Brain MRI unremarkable for acute process.  C-spine MRI-although motion limited-with questionable T2 hyperintensity in the right lateral cord and possible subtle cord signal hyperintensity at C5-6 with no abnormal cord enhancement.  Small posterior disc osteophyte complex at C6-C7 that contacts the left ventral cord without significant canal or foraminal stenosis. I have personally reviewed imaging-I do not think her clinical picture fits with the questionable abnormality seen on the C-spine MRI. Denies any bowel bladder issues. Denies any visual symptoms currently.  Denies headaches.  Denies shortness of breath chest pain nausea vomiting.   LKW: 12 AM on 06/02/2021 tpa given?: no, isolated arm pain and weakness-not consistent with stroke, imaging negative for stroke, and also arrived to the ER outside the  window Premorbid modified Rankin scale (mRS):0  ROS: Full ROS was performed and is negative except as noted in the HPI.   Past Medical History:  Diagnosis Date   Asthma    No family history on file.   Social History:   reports that she has never smoked. She has never used smokeless tobacco. She reports current alcohol use. She reports that she does not use drugs.  Medications  Current Facility-Administered Medications:    sodium chloride flush (NS) 0.9 % injection 3 mL, 3 mL, Intravenous, Once, Cathren Laine, MD  Current Outpatient Medications:    clindamycin (CLEOCIN) 150 MG capsule, Take 300 mg by mouth 4 (four) times daily., Disp: , Rfl:    loperamide (IMODIUM A-D) 2 MG tablet, Take 1 tablet (2 mg total) by mouth 4 (four) times daily as needed for diarrhea or loose stools., Disp: 30 tablet, Rfl: 0   promethazine (PHENERGAN) 25 MG tablet, Take 1 tablet (25 mg total) by mouth every 6 (six) hours as needed for nausea or vomiting., Disp: 10 tablet, Rfl: 1   Exam: Current vital signs: BP 134/82 (BP Location: Right Arm)   Pulse 87   Temp 98.5 F (36.9 C) (Oral)   Resp 18   LMP 05/24/2021   SpO2 100%  Vital signs in last 24 hours: Temp:  [98.5 F (36.9 C)] 98.5 F (36.9 C) (09/11 1203) Pulse Rate:  [78-97] 87 (09/11 1939) Resp:  [14-18] 18 (09/11 1939) BP: (125-155)/(74-105) 134/82 (09/11 1939) SpO2:  [98 %-100 %] 100 % (09/11 1939) GENERAL: Awake, alert in NAD HEENT: -  Normocephalic and atraumatic, dry mm, no LN++, no Thyromegally LUNGS - Clear to auscultation bilaterally with no wheezes CV - S1S2 RRR, no m/r/g, equal pulses bilaterally. ABDOMEN - Soft, nontender, nondistended with normoactive BS Ext: warm, well perfused, intact peripheral pulses, __ edema  NEURO:  Mental Status: AA&Ox3  Language: speech is clear.  Naming, repetition, fluency, and comprehension intact. Cranial Nerves: PERRL . EOMI, visual fields full, no facial asymmetry, facial sensation intact,  hearing intact, tongue/uvula/soft palate midline, normal sternocleidomastoid and trapezius muscle strength. No evidence of tongue atrophy or fibrillations Motor: . Left upper extremity at the shoulder is at least a 4/5 with limitation because of pain.  Did not get a sense of this being an effort dependent exam. Left elbow flexion and extension is barely 3/5.  Left wrist flexion extension 4/5.  Hand grip 4 -/5. Left  lower extremity 5/5 Right upper extremity 5/5 Right lower extremity 5/5. Tone: is normal and bulk is norma in all 4 extremities Sensation-somewhat diminished sensation to light touch on the left arm all around and left hand. Coordination: Finger-to-nose intact on the right, unable to perform on the left arm Gait- deferred   Labs I have reviewed labs in epic and the results pertinent to this consultation are:   CBC    Component Value Date/Time   WBC 11.8 (H) 06/02/2021 1234   RBC 4.29 06/02/2021 1234   HGB 11.6 (L) 06/02/2021 1234   HCT 36.5 06/02/2021 1234   PLT 287 06/02/2021 1234   MCV 85.1 06/02/2021 1234   MCH 27.0 06/02/2021 1234   MCHC 31.8 06/02/2021 1234   RDW 12.8 06/02/2021 1234   LYMPHSABS 2.8 06/02/2021 1234   MONOABS 1.0 06/02/2021 1234   EOSABS 0.1 06/02/2021 1234   BASOSABS 0.0 06/02/2021 1234    CMP     Component Value Date/Time   NA 137 06/02/2021 1234   K 3.5 06/02/2021 1234   CL 106 06/02/2021 1234   CO2 21 (L) 06/02/2021 1234   GLUCOSE 122 (H) 06/02/2021 1234   BUN 12 06/02/2021 1234   CREATININE 0.64 06/02/2021 1234   CALCIUM 8.6 (L) 06/02/2021 1234   PROT 7.4 06/02/2021 1234   ALBUMIN 3.6 06/02/2021 1234   AST 21 06/02/2021 1234   ALT 20 06/02/2021 1234   ALKPHOS 70 06/02/2021 1234   BILITOT 0.5 06/02/2021 1234   GFRNONAA >60 06/02/2021 1234   GFRAA >60 03/18/2018 0901    Lipid Panel  No results found for: CHOL, TRIG, HDL, CHOLHDL, VLDL, LDLCALC, LDLDIRECT   Imaging I have reviewed the images obtained: MR brain-negative  for acute process intracranially.  Adenoid hypertrophy with narrowing of nasopharynx. MR C-spine with and without contrast IMPRESSION: 1. Motion limited evaluation of the cord. Question subtle T2 hyperintensity within the right lateral cord at C3-C4 and possible subtle cord signal hyperintensity at C5-C6 versus artifact. No abnormal cord enhancement. 2. Small posterior disc osteophyte complex at C6-C7 contacts the left ventral cord without significant canal or foraminal stenosis.    Assessment: Sudden onset of left upper extremity weakness-predominantly left shoulder pain, mild left shoulder weakness and prominent left elbow flexion and extension weakness along with mild weakness in the distal muscles of the forearm and hand grip on the left with minimal sensory symptoms of light touch sensory loss on the left arm and left hand. 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 Imaging of brachial plexus on the left with and without contrast would be useful but she did receive contrast for brain  and C-spine MRIs and can obtain contrast for acute stroke worsening. She will need EMG/nerve conduction studies and steroids and NSAID treatment-which can be pursued outpatient.  I would recommend  imaging left shoulder to r/o acute process.  Recommendations: Shoulder xray Send ANA, RF, ANCA-will need outaptient f/u Steroid taper PO NSAIDs EMG NCS outpatient- [facility] - Dr. Allena Katz in next 1-2 weeks. Outpatient brachial plexus MRI w+w/o  Outpatient PT OT -- Milon Dikes, MD Neurologist [facility] Pager: [phone]

## 2021-06-02 NOTE — ED Notes (Signed)
Neuro at bedside.

## 2021-06-03 ENCOUNTER — Encounter: Payer: Self-pay | Admitting: Neurology

## 2021-06-04 LAB — RHEUMATOID FACTOR: Rheumatoid fact SerPl-aCnc: 23.5 IU/mL — ABNORMAL HIGH (ref <14.0)

## 2021-06-05 LAB — ANCA TITERS
Atypical P-ANCA titer: <1:20 {titer}
C-ANCA: <1:20 {titer}
P-ANCA: <1:20 {titer}

## 2021-06-07 ENCOUNTER — Emergency Department (HOSPITAL_COMMUNITY): Payer: BC Managed Care – PPO

## 2021-06-07 ENCOUNTER — Other Ambulatory Visit: Payer: Self-pay

## 2021-06-07 ENCOUNTER — Encounter (HOSPITAL_COMMUNITY): Payer: Self-pay

## 2021-06-07 ENCOUNTER — Emergency Department (HOSPITAL_COMMUNITY)
Admission: EM | Admit: 2021-06-07 | Discharge: 2021-06-07 | Disposition: A | Discharge status: Home / Self Care | Payer: BC Managed Care – PPO | Attending: Emergency Medicine | Admitting: Emergency Medicine

## 2021-06-07 DIAGNOSIS — D72829 Elevated white blood cell count, unspecified: Secondary | ICD-10-CM | POA: Diagnosis not present

## 2021-06-07 DIAGNOSIS — I10 Essential (primary) hypertension: Secondary | ICD-10-CM | POA: Insufficient documentation

## 2021-06-07 DIAGNOSIS — R0602 Shortness of breath: Secondary | ICD-10-CM | POA: Diagnosis not present

## 2021-06-07 DIAGNOSIS — R079 Chest pain, unspecified: Secondary | ICD-10-CM | POA: Diagnosis not present

## 2021-06-07 DIAGNOSIS — J45909 Unspecified asthma, uncomplicated: Secondary | ICD-10-CM | POA: Diagnosis not present

## 2021-06-07 DIAGNOSIS — R29898 Other symptoms and signs involving the musculoskeletal system: Secondary | ICD-10-CM

## 2021-06-07 DIAGNOSIS — R531 Weakness: Secondary | ICD-10-CM | POA: Diagnosis not present

## 2021-06-07 LAB — BASIC METABOLIC PANEL
Anion gap: 10 (ref 5–15)
BUN: 11 mg/dL (ref 6–20)
CO2: 23 mmol/L (ref 22–32)
Calcium: 9.3 mg/dL (ref 8.9–10.3)
Chloride: 103 mmol/L (ref 98–111)
Creatinine, Ser: 0.65 mg/dL (ref 0.44–1.00)
GFR, Estimated: >60 mL/min (ref >60)
Glucose, Bld: 144 mg/dL — ABNORMAL HIGH (ref 70–99)
Potassium: 3.7 mmol/L (ref 3.5–5.1)
Sodium: 136 mmol/L (ref 135–145)

## 2021-06-07 LAB — CBC WITH DIFFERENTIAL/PLATELET
Abs Immature Granulocytes: 0.1 10*3/uL — ABNORMAL HIGH (ref 0.00–0.07)
Basophils Absolute: 0 10*3/uL (ref 0.0–0.1)
Basophils Relative: 0 %
Eosinophils Absolute: 0 10*3/uL (ref 0.0–0.5)
Eosinophils Relative: 0 %
HCT: 35.1 % — ABNORMAL LOW (ref 36.0–46.0)
Hemoglobin: 11.3 g/dL — ABNORMAL LOW (ref 12.0–15.0)
Immature Granulocytes: 1 %
Lymphocytes Relative: 17 %
Lymphs Abs: 1.9 10*3/uL (ref 0.7–4.0)
MCH: 27.4 pg (ref 26.0–34.0)
MCHC: 32.2 g/dL (ref 30.0–36.0)
MCV: 85 fL (ref 80.0–100.0)
Monocytes Absolute: 0.6 10*3/uL (ref 0.1–1.0)
Monocytes Relative: 6 %
Neutro Abs: 8.9 10*3/uL — ABNORMAL HIGH (ref 1.7–7.7)
Neutrophils Relative %: 76 %
Platelets: 252 10*3/uL (ref 150–400)
RBC: 4.13 MIL/uL (ref 3.87–5.11)
RDW: 12.8 % (ref 11.5–15.5)
WBC: 11.6 10*3/uL — ABNORMAL HIGH (ref 4.0–10.5)
nRBC: 0 % (ref 0.0–0.2)

## 2021-06-07 LAB — I-STAT BETA HCG BLOOD, ED (MC, WL, AP ONLY): I-stat hCG, quantitative: <5 m[IU]/mL (ref <5)

## 2021-06-07 LAB — TROPONIN I (HIGH SENSITIVITY)
Troponin I (High Sensitivity): 7 ng/L (ref <18)
Troponin I (High Sensitivity): 8 ng/L (ref <18)

## 2021-06-07 MED ORDER — HYDROCHLOROTHIAZIDE 12.5 MG PO TABS
12.5000 mg | ORAL_TABLET | Freq: Every day | ORAL | 0 refills | Status: DC
Start: 2021-06-07 — End: 2021-10-25

## 2021-06-07 MED ORDER — HYDROCHLOROTHIAZIDE 25 MG PO TABS
25.0000 mg | ORAL_TABLET | Freq: Once | ORAL | Status: AC
  Administered 2021-06-07: 25 mg via ORAL
  Filled 2021-06-07: qty 1

## 2021-06-07 MED ORDER — ACETAMINOPHEN 500 MG PO TABS
1000.0000 mg | ORAL_TABLET | Freq: Once | ORAL | Status: AC
  Administered 2021-06-07: 1000 mg via ORAL
  Filled 2021-06-07: qty 2

## 2021-06-07 MED ORDER — KETOROLAC TROMETHAMINE 15 MG/ML IJ SOLN
15.0000 mg | Freq: Once | INTRAMUSCULAR | Status: AC
  Administered 2021-06-07: 15 mg via INTRAVENOUS
  Filled 2021-06-07: qty 1

## 2021-06-07 NOTE — ED Provider Notes (Signed)
[name] EMERGENCY DEPARTMENT Provider Note   CSN: [id number] Arrival date & time: 06/07/21  4431     History Chief Complaint  Patient presents with   Numbness   Shortness of Breath    Tina Mayer is a 35 y.o. female with PMHx asthma who presents for evaluation of hypertension.   Patient reports that both yesterday and today she noted that she was very hypertensive while using her automatic cuff at home.  She denies experiencing profound headache, chest pain, shortness of breath or abdominal pain.  She subsequently presented to our emergency department for further evaluation.  She states that she does not have a PCP at this time.  Of note, patient was evaluated in the ED for left shoulder pain and left arm weakness several days ago on 9/11.,  She underwent MRI head and cervical spine, which demonstrated hyperintensity within the right lateral cord at C3-C4 and possibly C5-C6.  Neurology was consulted at that time, who suspected brachial neuritis or Parsonage-Turner syndrome.  It very planned for outpatient neurology evaluation with EMG, nerve conduction studies, and possible MRI brachial plexus.  Patient was discharged and instructed to start both steroids and NSAIDs.  In the interim, she states that her weakness has gradually improved but not fully returned to baseline.  She is hoping to follow-up with neurology as scheduled in November.  Offered additional imaging such as MRI at this time but patient declined.     Past Medical History:  Diagnosis Date   Asthma     There are no problems to display for this patient.   Past Surgical History:  Procedure Laterality Date   CHOLECYSTECTOMY     TUBAL LIGATION       OB History   No obstetric history on file.     No family history on file.  Social History   Tobacco Use   Smoking status: Never   Smokeless tobacco: Never  Substance Use Topics   Alcohol use: Yes    Comment: occ   Drug use:  Never    Home Medications Prior to Admission medications   Medication Sig Start Date End Date Taking? Authorizing Provider  hydrochlorothiazide (HYDRODIURIL) 12.5 MG tablet Take 1 tablet (12.5 mg total) by mouth daily. 06/07/21  Yes Holley Dexter, MD  ibuprofen (ADVIL) 600 MG tablet Take 1 tablet (600 mg total) by mouth 3 (three) times daily for 14 days. 06/02/21 06/16/21 Yes Placido Sou, PA-C  INOSITOL PO Take 8 mg by mouth daily.   Yes [provider]  predniSONE (STERAPRED UNI-PAK 21 TAB) 10 MG (21) TBPK tablet Take by mouth daily. Take 6 tabs by mouth daily  for 2 days, then 5 tabs for 2 days, then 4 tabs for 2 days, then 3 tabs for 2 days, 2 tabs for 2 days, then 1 tab by mouth daily for 2 days 06/02/21  Yes Placido Sou, PA-C  pyridoxine (B-6) 250 MG tablet Take 250 mg by mouth daily.   Yes [provider]  vitamin B-12 (CYANOCOBALAMIN) 1000 MCG tablet Take 500 mcg by mouth daily.   Yes [provider]  promethazine (PHENERGAN) 25 MG tablet Take 1 tablet (25 mg total) by mouth every 6 (six) hours as needed for nausea or vomiting. Patient not taking: No sig reported 03/18/18   Vanetta Mulders, MD    Allergies    Penicillins  Review of Systems   Review of Systems  Constitutional:  Positive for fatigue. Negative for chills  and fever.  HENT:  Negative for ear pain and sore throat.   Eyes:  Negative for pain and visual disturbance.  Respiratory:  Negative for cough and shortness of breath.   Cardiovascular:  Negative for chest pain and palpitations.  Gastrointestinal:  Negative for abdominal pain and vomiting.  Genitourinary:  Negative for dysuria and hematuria.  Musculoskeletal:  Negative for arthralgias and back pain.  Skin:  Negative for color change and rash.  Neurological:  Positive for weakness and numbness. Negative for seizures and syncope.  All other systems reviewed and are negative.  Physical Exam Updated Vital Signs BP (!) 175/92    Pulse 63   Temp 98.7 F (37.1 C) (Oral)   Resp 16   Ht 5\' 6"  (1.676 m)   Wt 116.6 kg   LMP 05/24/2021   SpO2 100%   BMI 41.48 kg/m   Physical Exam Vitals and nursing note reviewed.  Constitutional:      General: She is not in acute distress.    Appearance: She is well-developed.  HENT:     Head: Normocephalic and atraumatic.  Eyes:     Conjunctiva/sclera: Conjunctivae normal.  Cardiovascular:     Rate and Rhythm: Normal rate and regular rhythm.     Heart sounds: No murmur heard. Pulmonary:     Effort: Pulmonary effort is normal. No respiratory distress.     Breath sounds: Normal breath sounds.  Abdominal:     Palpations: Abdomen is soft.     Tenderness: There is no abdominal tenderness.  Musculoskeletal:     Cervical back: Neck supple.  Skin:    General: Skin is warm and dry.  Neurological:     Mental Status: She is alert.    ED Results / Procedures / Treatments   Labs (all labs ordered are listed, but only abnormal results are displayed) Labs Reviewed  BASIC METABOLIC PANEL - Abnormal; Notable for the following components:      Result Value   Glucose, Bld 144 (*)    All other components within normal limits  CBC WITH DIFFERENTIAL/PLATELET - Abnormal; Notable for the following components:   WBC 11.6 (*)    Hemoglobin 11.3 (*)    HCT 35.1 (*)    Neutro Abs 8.9 (*)    Abs Immature Granulocytes 0.10 (*)    All other components within normal limits  I-STAT BETA HCG BLOOD, ED ([facility], [facility], [facility] ONLY)  TROPONIN I (HIGH SENSITIVITY)  TROPONIN I (HIGH SENSITIVITY)    EKG EKG Interpretation  Date/Time:  Friday June 07 2021 10:00:58 EDT Ventricular Rate:  67 PR Interval:  142 QRS Duration: 76 QT Interval:  394 QTC Calculation: 416 R Axis:   46 Text Interpretation: Normal sinus rhythm with sinus arrhythmia Normal ECG Confirmed by [id number] ([id number]) on 06/07/2021 8:02:30 PM  Radiology DG Chest 2 View  Result Date: 06/07/2021 CLINICAL DATA:  Left upper chest  pain, shortness of breath EXAM: CHEST - 2 VIEW COMPARISON:  03/18/2018 FINDINGS: Heart and mediastinal contours are within normal limits. No focal opacities or effusions. No acute bony abnormality. IMPRESSION: No active cardiopulmonary disease. Electronically Signed   By: 03/20/2018 M.D.   On: 06/07/2021 10:24    Procedures Procedures   Medications Ordered in ED Medications  hydrochlorothiazide (HYDRODIURIL) tablet 25 mg (25 mg Oral Given 06/07/21 2201)  ketorolac (TORADOL) 15 MG/ML injection 15 mg (15 mg Intravenous Given 06/07/21 2201)  acetaminophen (TYLENOL) tablet 1,000 mg (1,000 mg Oral Given 06/07/21 2201)  ED Course  I have reviewed the triage vital signs and the nursing notes.  Pertinent labs & imaging results that were available during my care of the patient were reviewed by me and considered in my medical decision making (see chart for details).    MDM Rules/Calculators/A&P                           35 y.o. female with past medical history as above who presents for evaluation of hypertension. Afebrile and hemodynamically stable, with vital signs notable for hypertension.  Exam as detailed above.  CBC with noncritical anemia and slight leukocytosis which is consistent with priors.  Likely secondary to steroid use.  Troponins are normal.  Pregnancy testing is negative.  EKG is normal sinus rhythm.  Chest x-ray without acute cardiopulmonary abnormality.  No evidence of hypertensive emergency.  Following HCTZ administration, blood pressures improved to within normal range.  Patient was discharged on low-dose hydrochlorothiazide.  Patient was provided with resources to establish care with with a PCP, as she does not have a PCP at this time.  Regarding left arm symptoms, patient states that they are gradually improving and she is comfortable following up with neurology in the outpatient setting.  Final Clinical Impression(s) / ED Diagnoses Final diagnoses:  Hypertension, unspecified  type  Left arm weakness    Rx / DC Orders ED Discharge Orders          Ordered    hydrochlorothiazide (HYDRODIURIL) 12.5 MG tablet  Daily        06/07/21 2234             Holley Dexter, MD 06/09/21 1303    Cheryll Cockayne, MD 06/26/21 [phone]

## 2021-06-07 NOTE — ED Provider Notes (Signed)
Emergency Medicine Provider Triage Evaluation Note  Tina Mayer , a 35 y.o. female  was evaluated in triage.  Pt complains of continued left hand and arm paresthesias.  Reports pain at the site of her left upper chest.  This has all been present since the past week.  She was seen and evaluated here on 06/02/2021 evaluated by neurology.  She was referred for further outpatient testing but is unable to see neurology outpatient until November.  States that the steroids did not help her symptoms and she is continuing to be symptomatic.  Also reports shortness of breath since yesterday.  Denies any cough, congestion, abdominal pain or vomiting.  Review of Systems  Positive: Shortness of breath, paresthesias Negative: fever  Physical Exam  BP (!) 170/95 (BP Location: Right Arm)   Pulse (!) 56   Temp 99.2 F (37.3 C) (Oral)   Resp 20   Ht 5\' 6"  (1.676 m)   Wt 116.6 kg   LMP 05/24/2021   SpO2 99%   BMI 41.48 kg/m  Gen:   Awake, no distress   Resp:  Normal effort  MSK:   Moves extremities without difficulty  Other:  strength 5/5 in bilateral upper and lower extremities.  Normal sensation to light touch of bilateral upper and lower extremities.  No facial asymmetry  Medical Decision Making  Medically screening exam initiated at 10:03 AM.  Appropriate orders placed.  Tina Mayer was informed that the remainder of the evaluation will be completed by another provider, this initial triage assessment does not replace that evaluation, and the importance of remaining in the ED until their evaluation is complete.  Lab work, imaging ekg   [name], PA-C 06/07/21 1005    06/09/21, MD 06/08/21 [phone]

## 2021-06-07 NOTE — ED Triage Notes (Signed)
Pt reports continued left hand and arm numbness, was seen here on Sunday for the same and was given prednisone and ibuprofen but it hasnt gotten any better. Pt also reports sob that started yesterday.

## 2021-06-10 LAB — ANTINUCLEAR ANTIBODIES, IFA: ANA Ab, IFA: NEGATIVE

## 2021-06-12 ENCOUNTER — Ambulatory Visit (INDEPENDENT_AMBULATORY_CARE_PROVIDER_SITE_OTHER): Payer: BC Managed Care – PPO | Admitting: Registered Nurse

## 2021-06-12 ENCOUNTER — Encounter: Payer: Self-pay | Admitting: Registered Nurse

## 2021-06-12 ENCOUNTER — Other Ambulatory Visit: Payer: Self-pay

## 2021-06-12 VITALS — BP 144/82 | HR 80 | Temp 98.1°F | Resp 18 | Ht 66.0 in | Wt 269.2 lb

## 2021-06-12 DIAGNOSIS — Z13220 Encounter for screening for lipoid disorders: Secondary | ICD-10-CM | POA: Diagnosis not present

## 2021-06-12 DIAGNOSIS — Z1329 Encounter for screening for other suspected endocrine disorder: Secondary | ICD-10-CM | POA: Diagnosis not present

## 2021-06-12 DIAGNOSIS — R531 Weakness: Secondary | ICD-10-CM

## 2021-06-12 DIAGNOSIS — Z130 Encounter for screening for diseases of the blood and blood-forming organs and certain disorders involving the immune mechanism: Secondary | ICD-10-CM

## 2021-06-12 DIAGNOSIS — Z13228 Encounter for screening for other metabolic disorders: Secondary | ICD-10-CM | POA: Diagnosis not present

## 2021-06-12 DIAGNOSIS — Z862 Personal history of diseases of the blood and blood-forming organs and certain disorders involving the immune mechanism: Secondary | ICD-10-CM

## 2021-06-12 DIAGNOSIS — D509 Iron deficiency anemia, unspecified: Secondary | ICD-10-CM | POA: Diagnosis not present

## 2021-06-12 DIAGNOSIS — I10 Essential (primary) hypertension: Secondary | ICD-10-CM

## 2021-06-12 DIAGNOSIS — Z1321 Encounter for screening for nutritional disorder: Secondary | ICD-10-CM

## 2021-06-12 DIAGNOSIS — R7309 Other abnormal glucose: Secondary | ICD-10-CM | POA: Diagnosis not present

## 2021-06-12 DIAGNOSIS — R4582 Worries: Secondary | ICD-10-CM | POA: Diagnosis not present

## 2021-06-12 LAB — URINALYSIS
Bilirubin Urine: NEGATIVE
Hgb urine dipstick: NEGATIVE
Ketones, ur: NEGATIVE
Leukocytes,Ua: NEGATIVE
Nitrite: NEGATIVE
Specific Gravity, Urine: <1.005 — AB (ref 1.000–1.030)
Total Protein, Urine: NEGATIVE
Urine Glucose: NEGATIVE
Urobilinogen, UA: 0.2 — AB (ref 0.0–1.0)
pH: 6 (ref 5.0–8.0)

## 2021-06-12 LAB — HEMOGLOBIN A1C: Hgb A1c MFr Bld: 6.3 % (ref 4.6–6.5)

## 2021-06-12 MED ORDER — AMLODIPINE BESY-BENAZEPRIL HCL 5-10 MG PO CAPS: 1.0000 | ORAL_CAPSULE | Freq: Every day | ORAL | 0 refills | Status: DC

## 2021-06-12 NOTE — Patient Instructions (Signed)
° ° ° °  If you have lab work done today you will be contacted with your lab results within the next 2 weeks.  If you have not heard from us then please contact us. The fastest way to get your results is to register for My Chart. ° ° °IF you received an x-ray today, you will receive an invoice from [facility]. Please contact [facility] at [phone] with questions or concerns regarding your invoice.  ° °IF you received labwork today, you will receive an invoice from [facility]. Please contact [facility] at [phone] with questions or concerns regarding your invoice.  ° °Our billing staff will not be able to assist you with questions regarding bills from these companies. ° °You will be contacted with the lab results as soon as they are available. The fastest way to get your results is to activate your My Chart account. Instructions are located on the last page of this paperwork. If you have not heard from us regarding the results in 2 weeks, please contact this office. °  ° ° ° °

## 2021-06-12 NOTE — Progress Notes (Signed)
New Patient Office Visit  Subjective:  Patient ID: Tina Mayer, female    DOB: [birth year]  Age: 35 y.o. MRN: [id number]  CC:  Chief Complaint  Patient presents with   New Patient (Initial Visit)    Patient states she is here to establish care. Patiient states she would need a referral to because she went to the er because she have no feeling and limited movement in her left arm.    HPI Tina Mayer presents for visit est   Presented to ED on 06/02/21 with sudden onset L arm weakness and numbness. At onset - woke at 3am after 2 hours of sleep with no movement in shoulder. No acute injury noted. No changes to medications or lifestyle.   Family hx of MS on mothers side. Pt never has had symptoms like this No fam hx of autoimmune processes  CT and MRI at hospital showed no acute abnormalities. MRI neck showed question of T2 enhancing lesion likely noncontributory to the symptoms in the C spine, though exam limited due to motion.   She has been started on ibuprofen 600mg  po bid and prednisone taper Notes more movement in arm but still extremely limited to under 20 degrees of shoulder motion in any direction, completely weak through elbow, limited motion in all aspects of wrist. Passive ROM intact, no pain Some strength and sensation back distally  Notes some pain towards chest and upper back but thinks this may be in result of compensating for her weakness. Was seen in ED and noted to be hypertensive, started on HCTZ, EKG unremarkable.   No new symptoms. In regards to the past few weeks, declines visual changes, symptoms of respiratory illness, GI symptoms, headaches, weakness in any other limb.   Past Medical History:  Diagnosis Date   Asthma     Past Surgical History:  Procedure Laterality Date   CHOLECYSTECTOMY     TUBAL LIGATION      No family history on file.  Social History   Socioeconomic History   Marital status: Divorced    Spouse name:  Not on file   Number of children: Not on file   Years of education: Not on file   Highest education level: Not on file  Occupational History   Not on file  Tobacco Use   Smoking status: Never   Smokeless tobacco: Never  Substance and Sexual Activity   Alcohol use: Yes    Comment: occ   Drug use: Never   Sexual activity: Not on file  Other Topics Concern   Not on file  Social History Narrative   Not on file   Social Determinants of Health   Financial Resource Strain: Not on file  Food Insecurity: Not on file  Transportation Needs: Not on file  Physical Activity: Not on file  Stress: Not on file  Social Connections: Not on file  Intimate Partner Violence: Not on file    ROS Review of Systems  Constitutional: Negative.  Negative for activity change, appetite change and unexpected weight change.  HENT: Negative.    Eyes: Negative.   Respiratory: Negative.    Cardiovascular: Negative.   Gastrointestinal: Negative.   Endocrine: Negative.   Genitourinary: Negative.   Musculoskeletal: Negative.   Skin: Negative.   Allergic/Immunologic: Negative.   Neurological:  Positive for weakness and numbness. Negative for dizziness, tremors, seizures, syncope, facial asymmetry, speech difficulty, light-headedness and headaches.  Psychiatric/Behavioral: Negative.    All other systems reviewed and are negative.  Objective:   Today's Vitals: BP (!) 144/82   Pulse 80   Temp 98.1 F (36.7 C) (Temporal)   Resp 18   Ht 5\' 6"  (1.676 m)   Wt 269 lb 3.2 oz (122.1 kg)   LMP 05/24/2021   SpO2 99%   BMI 43.45 kg/m   Physical Exam Vitals and nursing note reviewed.  Constitutional:      General: She is not in acute distress.    Appearance: Normal appearance. She is not ill-appearing, toxic-appearing or diaphoretic.  Eyes:     General: No visual field deficit. Cardiovascular:     Rate and Rhythm: Normal rate and regular rhythm.     Pulses: Normal pulses.     Heart sounds: Normal  heart sounds. No murmur heard.   No friction rub. No gallop.  Pulmonary:     Effort: Pulmonary effort is normal. No respiratory distress.     Breath sounds: Normal breath sounds. No stridor. No wheezing, rhonchi or rales.  Chest:     Chest wall: No tenderness.  Musculoskeletal:        General: Tenderness (mild, joint line shoulder.) present.  Skin:    General: Skin is warm and dry.     Capillary Refill: Capillary refill takes less than 2 seconds.  Neurological:     General: No focal deficit present.     Mental Status: She is alert and oriented to person, place, and time. Mental status is at baseline.     GCS: GCS eye subscore is 4. GCS verbal subscore is 5. GCS motor subscore is 6.     Cranial Nerves: Cranial nerves are intact. No cranial nerve deficit, dysarthria or facial asymmetry.     Sensory: Sensory deficit (reports numbness. some tingling in wrist with pressure on anatomical snuff box. appropriately identifies sharp and soft touch across both upper extremities.) present.     Motor: Weakness present. No tremor, atrophy, abnormal muscle tone, seizure activity or pronator drift.     Coordination: Romberg sign negative. Coordination normal. Finger-Nose-Finger Test abnormal (due to inability to lift L arm). Heel to [facility] Test normal. Impaired rapid alternating movements (due to weakness in shoulder).     Gait: Gait is intact. Gait normal.     Deep Tendon Reflexes: Reflexes abnormal.  Psychiatric:        Mood and Affect: Mood normal.        Behavior: Behavior normal.        Thought Content: Thought content normal.        Judgment: Judgment normal.    Assessment & Plan:   Problem List Items Addressed This Visit   None Visit Diagnoses     Acute left-sided weakness    -  Primary   Relevant Orders   Urinalysis   CBC with Differential/Platelet   RPR   TSH   T4, free   MR TOTAL SPINE W WO CONTRAST   MR SHOULDER LEFT W WO CONTRAST   Screening for endocrine, metabolic and immunity  disorder       Relevant Orders   Urinalysis   CBC with Differential/Platelet   TSH   T4, free   MR TOTAL SPINE W WO CONTRAST   MR SHOULDER LEFT W WO CONTRAST   Lipid screening       Relevant Orders   Lipid panel   Encounter for vitamin deficiency screening       Relevant Orders   B12 and Folate Panel   Vitamin D (25 hydroxy)  History of anemia       Relevant Orders   Iron, TIBC and Ferritin Panel   Elevated glucose       Relevant Orders   Hemoglobin A1c   Primary hypertension       Relevant Medications   amLODipine-benazepril (LOTREL) 5-10 MG capsule       Outpatient Encounter Medications as of 06/12/2021  Medication Sig   amLODipine-benazepril (LOTREL) 5-10 MG capsule Take 1 capsule by mouth daily.   hydrochlorothiazide (HYDRODIURIL) 12.5 MG tablet Take 1 tablet (12.5 mg total) by mouth daily.   ibuprofen (ADVIL) 600 MG tablet Take 1 tablet (600 mg total) by mouth 3 (three) times daily for 14 days.   INOSITOL PO Take 8 mg by mouth daily.   predniSONE (STERAPRED UNI-PAK 21 TAB) 10 MG (21) TBPK tablet Take by mouth daily. Take 6 tabs by mouth daily  for 2 days, then 5 tabs for 2 days, then 4 tabs for 2 days, then 3 tabs for 2 days, 2 tabs for 2 days, then 1 tab by mouth daily for 2 days   pyridoxine (B-6) 250 MG tablet Take 250 mg by mouth daily.   vitamin B-12 (CYANOCOBALAMIN) 1000 MCG tablet Take 500 mcg by mouth daily.   promethazine (PHENERGAN) 25 MG tablet Take 1 tablet (25 mg total) by mouth every 6 (six) hours as needed for nausea or vomiting. (Patient not taking: No sig reported)   No facility-administered encounter medications on file as of 06/12/2021.    Follow-up: No follow-ups on file.   PLAN Unclear etiology. Suggested by ER docs that perhaps brachial plexus abnormality - will order MRI shoulder to help determine if this is playing a role. Order MRI spine to investigate for an suspicious lesions and better characterize abnormalities in c spine.  Wide set of  labs collected today, including routine screening for lipids, a1c given recent hx of elevated glucose. Reviewed labs from ER with patient, including mild elevation in RF. While nondiagnostic for her symptoms, it does give insight into potential rheumatological workup. However, agree with ER that problem seems to be neuro. Consideration for ortho etiology given, but in that she has been very active without acute injury or change to routine, the acuity of this is concerning . HTN management - stop HCTZ, start amlodipine-benazepril as above. Reviewed risks, benefits and alternatives with pt who voices understanding. BP only mildly elevated but discussed goal of below 130/80. Pt voices understanding. Return in 3 mo, sooner with concerns from pt or concerns on labs. Plan to CC Dr. Allena Katz on today's visit for consideration that she be seen sooner than her scheduled appt time if possible. Patient encouraged to call clinic with any questions, comments, or concerns.  I spent 65 minutes with this patient in chart review, reviewing lab work, coordinating imaging and referrals, and discussing potential etiologies with patient.  Janeece Agee, NP

## 2021-06-13 LAB — TSH: TSH: 0.71 u[IU]/mL (ref 0.35–5.50)

## 2021-06-13 LAB — LIPID PANEL
Cholesterol: 176 mg/dL (ref 0–200)
HDL: 46.8 mg/dL (ref >39.00)
LDL Cholesterol: 110 mg/dL — ABNORMAL HIGH (ref 0–99)
NonHDL: 129.61
Total CHOL/HDL Ratio: 4
Triglycerides: 97 mg/dL (ref 0.0–149.0)
VLDL: 19.4 mg/dL (ref 0.0–40.0)

## 2021-06-13 LAB — CBC WITH DIFFERENTIAL/PLATELET
Basophils Absolute: 0.1 10*3/uL (ref 0.0–0.1)
Basophils Relative: 0.6 % (ref 0.0–3.0)
Eosinophils Absolute: 0 10*3/uL (ref 0.0–0.7)
Eosinophils Relative: 0 % (ref 0.0–5.0)
HCT: 37.2 % (ref 36.0–46.0)
Hemoglobin: 12.1 g/dL (ref 12.0–15.0)
Lymphocytes Relative: 14 % (ref 12.0–46.0)
Lymphs Abs: 1.3 10*3/uL (ref 0.7–4.0)
MCHC: 32.6 g/dL (ref 30.0–36.0)
MCV: 83.8 fl (ref 78.0–100.0)
Monocytes Absolute: 0.3 10*3/uL (ref 0.1–1.0)
Monocytes Relative: 2.6 % — ABNORMAL LOW (ref 3.0–12.0)
Neutro Abs: 7.9 10*3/uL — ABNORMAL HIGH (ref 1.4–7.7)
Neutrophils Relative %: 82.8 % — ABNORMAL HIGH (ref 43.0–77.0)
Platelets: 255 10*3/uL (ref 150.0–400.0)
RBC: 4.44 Mil/uL (ref 3.87–5.11)
RDW: 14.3 % (ref 11.5–15.5)
WBC: 9.6 10*3/uL (ref 4.0–10.5)

## 2021-06-13 LAB — B12 AND FOLATE PANEL
Folate: 17.6 ng/mL (ref >5.9)
Vitamin B-12: >1550 pg/mL — ABNORMAL HIGH (ref 211–911)

## 2021-06-13 LAB — RPR: RPR Ser Ql: NONREACTIVE

## 2021-06-13 LAB — IRON,TIBC AND FERRITIN PANEL
%SAT: 26 % (calc) (ref 16–45)
Ferritin: 19 ng/mL (ref 16–154)
Iron: 118 ug/dL (ref 40–190)
TIBC: 455 mcg/dL (calc) — ABNORMAL HIGH (ref 250–450)

## 2021-06-13 LAB — T4, FREE: Free T4: 0.75 ng/dL (ref 0.60–1.60)

## 2021-06-13 LAB — VITAMIN D 25 HYDROXY (VIT D DEFICIENCY, FRACTURES): VITD: 7.74 ng/mL — ABNORMAL LOW (ref 30.00–100.00)

## 2021-06-14 ENCOUNTER — Encounter: Payer: Self-pay | Admitting: Neurology

## 2021-06-14 ENCOUNTER — Ambulatory Visit: Payer: BC Managed Care – PPO | Admitting: Neurology

## 2021-06-14 ENCOUNTER — Other Ambulatory Visit: Payer: Self-pay

## 2021-06-14 VITALS — BP 151/89 | HR 93 | Ht 66.0 in | Wt 266.0 lb

## 2021-06-14 DIAGNOSIS — R202 Paresthesia of skin: Secondary | ICD-10-CM | POA: Diagnosis not present

## 2021-06-14 DIAGNOSIS — R29898 Other symptoms and signs involving the musculoskeletal system: Secondary | ICD-10-CM | POA: Diagnosis not present

## 2021-06-14 NOTE — Progress Notes (Signed)
[facility] Clinic Note - Initial Visit   Date: 06/14/21  Tina Mayer MRN: [id number] DOB: Aug 16, 1986   Dear Maximiano Coss, NP:  Thank you for your kind referral of Tina Mayer for consultation of left arm weakness. Although her history is well known to you, please allow [place] to reiterate it for the purpose of our medical record. The patient was accompanied to the clinic by self.   History of Present Illness: Tina Mayer is a 35 y.o. right-handed female with hypertension presenting for urgent evaluation of left arm weakness. She woke up on Sunday, 06/02/2021 and realized that she was unable to raise her left hand and arm.  She felt as if he was numb and was unable to grasp her door handle.  Her arm was essentially limp with no movement from the upper arm into her fingers.  She went to the ER and had MRI brain and cervical spine.  MRI brain was negative for stroke.  There was a questionable cord hyperintensity at C3-4 on the right, which upon my review is not prominent and would not explain her left sided symptoms.  She was started on prednisone taper pack and feels that her weakness has improved because she is able to supinate her arm and flex her fingers.  She is not able to raise her arm, bend at the elbow, or flex at the wrist.  She has tingling over the fingertips.  She had COVID during the end of August manifesting with cough and fatigue.  She did not need any treatment.   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   Out-side paper records, electronic medical record, and images have been reviewed where available and summarized as:  MRI cervical spine wwo contrast 06/02/2021: 1. Motion limited evaluation of the cord. Question subtle T2 hyperintensity within the right lateral cord at C3-C4 and possible subtle cord signal hyperintensity at C5-C6 versus  artifact. No abnormal cord enhancement. 2. Small posterior disc osteophyte complex at C6-C7 contacts the left ventral cord without significant canal or foraminal stenosis.   MRI brain wwo contrast 06/02/2021: 1. No evidence of acute intracranial abnormality. 2. 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   Lab Results  Component Value Date   HGBA1C 6.3 06/12/2021   Lab Results  Component Value Date   VITAMINB12 >1550 (H) 06/12/2021   Lab Results  Component Value Date   TSH 0.71 06/12/2021   Lab Results  Component Value Date   ESRSEDRATE 20 06/02/2021    Past Medical History:  Diagnosis Date   Asthma    Hypertension     Past Surgical History:  Procedure Laterality Date   CHOLECYSTECTOMY     TUBAL LIGATION       Medications:  Outpatient Encounter Medications as of 06/14/2021  Medication Sig Note   ferrous sulfate 325 (65 FE) MG EC tablet Take 325 mg by mouth 3 (three) times daily with meals.    hydrochlorothiazide (HYDRODIURIL) 12.5 MG tablet Take 1 tablet (12.5 mg total) by mouth daily.    ibuprofen (ADVIL) 600 MG tablet Take 1 tablet (600 mg total) by mouth 3 (three) times daily for 14 days. 06/07/2021: Course began 06/03/21 pt took 3 doses.pt took 2 doses 06/06/21   INOSITOL PO Take 8 mg by mouth daily.    predniSONE (STERAPRED UNI-PAK 21 TAB) 10 MG (21) TBPK tablet Take by mouth  daily. Take 6 tabs by mouth daily  for 2 days, then 5 tabs for 2 days, then 4 tabs for 2 days, then 3 tabs for 2 days, 2 tabs for 2 days, then 1 tab by mouth daily for 2 days 06/07/2021: Course began 06/03/21    pyridoxine (B-6) 250 MG tablet Take 250 mg by mouth daily.    vitamin B-12 (CYANOCOBALAMIN) 1000 MCG tablet Take 500 mcg by mouth daily.    amLODipine-benazepril (LOTREL) 5-10 MG capsule Take 1 capsule by mouth daily. (Patient not taking: Reported on 06/14/2021)    No facility-administered encounter medications on file as of 06/14/2021.    Allergies:  Allergies  Allergen  Reactions   Penicillins Rash    Has patient had a PCN reaction causing immediate rash, facial/tongue/throat swelling, SOB or lightheadedness with hypotension: No Has patient had a PCN reaction causing severe rash involving mucus membranes or skin necrosis: No Has patient had a PCN reaction that required hospitalization: No Has patient had a PCN reaction occurring within the last 10 years: No If all of the above answers are "NO", then may proceed with Cephalosporin use.    Family History: Family History  Problem Relation Age of Onset   Hypertension Mother    Testicular cancer Father    Diabetes Maternal Grandmother    Hypertension Maternal Grandmother     Social History: Social History   Tobacco Use   Smoking status: Never   Smokeless tobacco: Never  Substance Use Topics   Alcohol use: Yes    Comment: occ   Drug use: Never   Social History   Social History Narrative   Right Handed    Lives in a one story home     Vital Signs:  BP (!) 151/89   Pulse 93   Ht _0  (1.676 m)   Wt 266 lb (120.7 kg)   LMP 05/24/2021   SpO2 97%   BMI 42.93 kg/m    Neurological Exam: MENTAL STATUS including orientation to time, place, person, recent and remote memory, attention span and concentration, language, and fund of knowledge is normal.  Speech is not dysarthric.  CRANIAL NERVES: II:  No visual field defects.    III-IV-VI: Pupils equal round and reactive to light.  Normal conjugate, extra-ocular eye movements in all directions of gaze.  No nystagmus.  No ptosis.   V:  Normal facial sensation.    VII:  Normal facial symmetry and movements.   VIII:  Normal hearing and vestibular function.   IX-X:  Normal palatal movement.   XI:  Normal shoulder shrug and head rotation.   XII:  Normal tongue strength and range of motion, no deviation or fasciculation.  MOTOR:  Mild generalized loss of muscle bulk in the left hand.  No fasciculations or abnormal movements.  No pronator drift.    Upper Extremity:  Right  Left  Deltoid  5/5   1+/5   Biceps  5/5   3/5   Triceps  5/5   4+/5   Infraspinatus 5/5  3/5  Medial pectoralis 5/5  4/5  Wrist extensors  5/5   4/5   Wrist flexors  5/5   3/5   Finger extensors  5/5   4/5   Finger flexors  5/5   4/5   Dorsal interossei  5/5   4/5   Abductor pollicis  5/5   4+/5   Tone (Ashworth scale)  0  0   Lower Extremity:  Right  Left  Hip flexors  5/5   5/5   Hip extensors  5/5   5/5   Adductor 5/5  5/5  Abductor 5/5  5/5  Knee flexors  5/5   5/5   Knee extensors  5/5   5/5   Dorsiflexors  5/5   5/5   Plantarflexors  5/5   5/5   Toe extensors  5/5   5/5   Toe flexors  5/5   5/5   Tone (Ashworth scale)  0  0   MSRs:  Right        Left                  brachioradialis 2+  2+  biceps 2+  2+  triceps 2+  2+  patellar 2+  2+  ankle jerk 2+  2+  Hoffman no  no  plantar response down  down   SENSORY:  Reduced pin prick and temperature over the lateral upper arm, forearm, and hand on the left.  Sensation is preserved over the medial forearm.  COORDINATION/GAIT: Normal finger-to- nose-finger and heel-to-shin.  Intact rapid alternating movements bilaterally.  Able to rise from a chair without using arms.  Gait narrow based and stable. Tandem and stressed gait intact.    IMPRESSION: Acute onset of left arm weakness and paresthesias.  Exam shows significant weakness involving all the muscles, especially involving the C5, C6, and C7 myotomes. She also has sensory loss over these dermatomes. MRI brain and cervical spine was personally viewed and does not disclose structural abnormality to explain her weakness.  Ddx:  brachial plexopathy vs polyradiculoneuropathy, possible parinfectious from COVID.   PLAN/RECOMMENDATIONS:  NCS/EMG of the left arm - brachial plexus protocol - ASAP.  Check ESR, CRP, vitamin B12, folate, copper, SPEP with IFE, ANA, TSH Start physical therapy for left arm strengthening Stop vitamin B6  Further  recommendations pending results.    Thank you for allowing me to participate in patient's care.  If I can answer any additional questions, I would be pleased to do so.    Sincerely,    Trevante Tennell K. Posey Pronto, DO

## 2021-06-14 NOTE — Patient Instructions (Signed)
Nerve testing of the left arm - brachial plexus protocol.  Do not apply any lotion, oil on your hands or arms on the day of testing.  We will schedule this for October 5th at 1:30p, please arrive 15 min prior to your appointment.   Check labs  Start physical therapy  Stop vitamin B6

## 2021-06-17 ENCOUNTER — Encounter: Payer: Self-pay | Admitting: Registered Nurse

## 2021-06-17 DIAGNOSIS — R531 Weakness: Secondary | ICD-10-CM | POA: Diagnosis not present

## 2021-06-17 DIAGNOSIS — R202 Paresthesia of skin: Secondary | ICD-10-CM | POA: Diagnosis not present

## 2021-06-17 DIAGNOSIS — M25512 Pain in left shoulder: Secondary | ICD-10-CM | POA: Diagnosis not present

## 2021-06-17 DIAGNOSIS — M79602 Pain in left arm: Secondary | ICD-10-CM | POA: Diagnosis not present

## 2021-06-18 ENCOUNTER — Other Ambulatory Visit: Payer: Self-pay | Admitting: Registered Nurse

## 2021-06-18 DIAGNOSIS — E559 Vitamin D deficiency, unspecified: Secondary | ICD-10-CM

## 2021-06-18 MED ORDER — VITAMIN D (ERGOCALCIFEROL) 1.25 MG (50000 UNIT) PO CAPS
50000.0000 [IU] | ORAL_CAPSULE | ORAL | 0 refills | Status: DC
Start: 2021-06-18 — End: 2021-06-19

## 2021-06-19 ENCOUNTER — Other Ambulatory Visit: Payer: Self-pay

## 2021-06-19 DIAGNOSIS — Z0279 Encounter for issue of other medical certificate: Secondary | ICD-10-CM

## 2021-06-19 DIAGNOSIS — E559 Vitamin D deficiency, unspecified: Secondary | ICD-10-CM

## 2021-06-19 MED ORDER — VITAMIN D (ERGOCALCIFEROL) 1.25 MG (50000 UNIT) PO CAPS: 50000.0000 [IU] | ORAL_CAPSULE | ORAL | 0 refills | Status: DC

## 2021-06-21 DIAGNOSIS — R202 Paresthesia of skin: Secondary | ICD-10-CM | POA: Diagnosis not present

## 2021-06-21 DIAGNOSIS — R531 Weakness: Secondary | ICD-10-CM | POA: Diagnosis not present

## 2021-06-21 DIAGNOSIS — M25512 Pain in left shoulder: Secondary | ICD-10-CM | POA: Diagnosis not present

## 2021-06-21 DIAGNOSIS — M79602 Pain in left arm: Secondary | ICD-10-CM | POA: Diagnosis not present

## 2021-06-26 ENCOUNTER — Other Ambulatory Visit: Payer: Self-pay

## 2021-06-26 ENCOUNTER — Ambulatory Visit (INDEPENDENT_AMBULATORY_CARE_PROVIDER_SITE_OTHER): Payer: BC Managed Care – PPO | Admitting: Neurology

## 2021-06-26 DIAGNOSIS — R202 Paresthesia of skin: Secondary | ICD-10-CM

## 2021-06-26 DIAGNOSIS — R29898 Other symptoms and signs involving the musculoskeletal system: Secondary | ICD-10-CM

## 2021-06-26 NOTE — Procedures (Signed)
[facility]  [address] [place], [address]  [place], [place] [address] Tel: [phone] Fax:  [phone] Test Date:  06/26/2021  Patient: Tina Mayer DOB: Jul 13, 1986 Physician: Nita Sickle, DO  Sex: Female Height: 5\' 6"  Ref Phys: , DO  ID#: Nita Sickle   Technician:    Patient Complaints: This is a 35 year old female referred for evaluation of the left arm weakness and paresthesias.  NCV & EMG Findings: Extensive electrodiagnostic testing of the left upper extremity and additional studies of the right shows:  All sensory responses including the left median, ulnar, radial, bilateral medial antebrachial and bilateral lateral antebrachial sensory responses are within normal limits.   Left median and ulnar motor responses are within normal limits.   Reduced recruitment is seen in the first dorsal interosseous and abductor digit he minimi muscles.  Active on chronic motor axon loss changes are seen involving the C5-6 myotomes on the left.  These findings are not present in the right upper extremity.    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  Although a patchy brachial plexopathy could also manifest with a similar pattern, the presence of normal sensory responses makes this less likely.  Correlate clinically.   ___________________________ 30, DO    Nerve Conduction Studies Anti Sensory Summary Table   Stim Site NR Peak (ms) Norm Peak (ms) P-T Amp (V) Norm P-T Amp  Left Lat Ante Brach Cutan Anti Sensory (Lat Forearm)  32C  Lat Biceps    1.9 <2.9 18.9 >14  Right Lat Ante Brach Cutan Anti Sensory (Lat Forearm)  32C  Lat Biceps    2.5 <2.9 19.5 >14  Left Med Ante Brach Cutan Anti Sensory (Med Forearm)  32C  Elbow    2.7  12.7   Right Med Ante Brach Cutan Anti Sensory (Med  Forearm)  32C  Elbow    2.1  10.7   Left Median Anti Sensory (2nd Digit)  32C  Wrist    2.7 <3.4 44.1 >20  Left Radial Anti Sensory (Base 1st Digit)  32C  Wrist    2.1 <2.7 37.0 >18  Left Ulnar Anti Sensory (5th Digit)  32C  Wrist    2.7 <3.1 49.8 >12   Motor Summary Table   Stim Site NR Onset (ms) Norm Onset (ms) O-P Amp (mV) Norm O-P Amp Site1 Site2 Delta-0 (ms) Dist (cm) Vel (m/s) Norm Vel (m/s)  Left Median Motor (Abd Poll Brev)  32C  Wrist    3.0 <3.9 12.2 >6 Elbow Wrist 5.4 30.0 56 >50  Elbow    8.4  12.1         Left Ulnar Motor (Abd Dig Minimi)  32C  Wrist    2.2 <3.1 10.0 >7 B Elbow Wrist 4.0 22.0 55 >50  B Elbow    6.2  9.2  A Elbow B Elbow 1.9 10.0 53 >50  A Elbow    8.1  9.1          EMG   Side Muscle Ins Act Fibs Psw Fasc Number Recrt Dur Dur. Amp Amp. Poly Poly. Comment  Left 1stDorInt Nml Nml Nml Nml 1- Rapid Nml Nml Nml Nml Nml Nml N/A  Left PronatorTeres Nml Nml Nml Nml Nml Nml Nml Nml Nml Nml Nml Nml N/A  Left Ext Indicis Nml Nml Nml Nml Nml Nml Nml Nml Nml Nml Nml Nml  N/A  Left ABD Dig Min Nml Nml Nml Nml 1- Rapid Nml Nml Nml Nml Nml Nml N/A  Left Biceps Nml 2+ Nml Nml 2- Rapid Most 1+ Nml Nml Most 1+ N/A  Left Abd Poll Brev Nml Nml Nml Nml Nml Nml Nml Nml Nml Nml Nml Nml N/A  Left Deltoid Nml 2+ Nml Nml 3- Rapid Most 1+ Nml Nml Most 1+ N/A  Left Infraspinatus Nml Nml Nml Nml 2- Rapid Many 1+ Nml Nml Many 1+ N/A  Left Cervical Parasp Low Nml Nml Nml Nml NE - - - - - - - N/A  Right Biceps Nml Nml Nml Nml Nml Nml Nml Nml Nml Nml Nml Nml N/A  Right Deltoid Nml Nml Nml Nml Nml Nml Nml Nml Nml Nml Nml Nml N/A      Waveforms:

## 2021-06-27 ENCOUNTER — Telehealth: Payer: Self-pay | Admitting: Neurology

## 2021-06-27 DIAGNOSIS — M5410 Radiculopathy, site unspecified: Secondary | ICD-10-CM

## 2021-06-27 DIAGNOSIS — R202 Paresthesia of skin: Secondary | ICD-10-CM

## 2021-06-27 DIAGNOSIS — R531 Weakness: Secondary | ICD-10-CM | POA: Diagnosis not present

## 2021-06-27 DIAGNOSIS — M79602 Pain in left arm: Secondary | ICD-10-CM | POA: Diagnosis not present

## 2021-06-27 DIAGNOSIS — R29898 Other symptoms and signs involving the musculoskeletal system: Secondary | ICD-10-CM

## 2021-06-27 DIAGNOSIS — M25512 Pain in left shoulder: Secondary | ICD-10-CM | POA: Diagnosis not present

## 2021-06-27 NOTE — Telephone Encounter (Signed)
Pt is returning a call to [facility] regarding results

## 2021-06-28 NOTE — Telephone Encounter (Signed)
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  Although a patchy brachial plexopathy could also manifest with a similar pattern, the presence of normal sensory responses makes this less likely.  Correlate clinically.  Overall, her NCS/EMG did appear to be better than I had expected, especially with respect to sensory responses which was normal.  Her needle electrode exam did show neurogenic changes, worse in proximal muscles.  MRI cervical spine from 9/11 did not show any specific nerve root pathology to explain this.  Serology testing has also been extensive and largely unremarkable.   At this point, I suggest we look for polyradiculoneuropathy with CSF testing.  We will proceed with lumbar puncture - check CSF cell count and diff, protein, glucose, IgG index, oligoclonal bands, myelin basic protein, flow cytology, CSF Lyme, CSF ACE.  Continue PT Consider repeat MRI cervical spine wwo contrast based on results of spinal tap  Tina Mayer K. Allena Katz, DO

## 2021-07-01 NOTE — Telephone Encounter (Signed)
Ordered lumbar puncture - check CSF cell count and diff, protein, glucose, IgG index, oligoclonal bands, myelin basic protein, flow cytology, CSF Lyme, CSF ACE.

## 2021-07-01 NOTE — Addendum Note (Signed)
Addended by: Karl Luke A on: 07/01/2021 09:16 AM   Modules accepted: Orders

## 2021-07-03 ENCOUNTER — Other Ambulatory Visit (HOSPITAL_COMMUNITY)
Admission: RE | Admit: 2021-07-03 | Discharge: 2021-07-03 | Disposition: A | Discharge status: Home / Self Care | Payer: BC Managed Care – PPO | Source: Ambulatory Visit | Attending: Neurology | Admitting: Neurology

## 2021-07-03 ENCOUNTER — Other Ambulatory Visit: Payer: Self-pay

## 2021-07-03 ENCOUNTER — Ambulatory Visit
Admission: RE | Admit: 2021-07-03 | Discharge: 2021-07-03 | Disposition: A | Discharge status: Home / Self Care | Payer: BC Managed Care – PPO | Source: Ambulatory Visit | Attending: Neurology | Admitting: Neurology

## 2021-07-03 VITALS — BP 142/84 | HR 72

## 2021-07-03 DIAGNOSIS — R531 Weakness: Secondary | ICD-10-CM | POA: Diagnosis not present

## 2021-07-03 DIAGNOSIS — R202 Paresthesia of skin: Secondary | ICD-10-CM

## 2021-07-03 DIAGNOSIS — M5410 Radiculopathy, site unspecified: Secondary | ICD-10-CM | POA: Diagnosis not present

## 2021-07-03 DIAGNOSIS — G6189 Other inflammatory polyneuropathies: Secondary | ICD-10-CM | POA: Diagnosis not present

## 2021-07-03 DIAGNOSIS — R29898 Other symptoms and signs involving the musculoskeletal system: Secondary | ICD-10-CM

## 2021-07-03 NOTE — Discharge Instructions (Signed)

## 2021-07-03 NOTE — Progress Notes (Signed)
1 vial of blood drawn from pts Left hand by A. Chad, [employer] fundraiser, to be sent with LP lab work. 1 successful attempt. Pt tolerated well. Gauze and tape applied after.

## 2021-07-04 LAB — CYTOLOGY - NON PAP

## 2021-07-11 LAB — CNS IGG SYNTHESIS RATE, CSF+BLOOD
Albumin Serum: 4 g/dL (ref 3.5–5.2)
Albumin, CSF: 12.7 mg/dL (ref 8.0–42.0)
CNS-IgG Synthesis Rate: -3.1 mg/24 h (ref <=3.3)
IgG (Immunoglobin G), Serum: 1410 mg/dL (ref 600–1640)
IgG Total CSF: 2.5 mg/dL (ref 0.8–7.7)
IgG-Index: 0.56 (ref <0.66)

## 2021-07-11 LAB — CSF CELL COUNT WITH DIFFERENTIAL
RBC Count, CSF: 3 cells/uL — ABNORMAL HIGH
WBC, CSF: 2 cells/uL (ref 0–5)

## 2021-07-11 LAB — BORRELIA SPECIES DNA, FLUID, PCR: B. burgdorferi DNA: NOT DETECTED

## 2021-07-11 LAB — ANGIOTENSIN CONVERTING ENZYME, CSF: ANGIOTENSIN CONVERTING ENZYME ( ACE) CSF: 7 U/L (ref <=15)

## 2021-07-11 LAB — PROTEIN, CSF: Total Protein, CSF: 25 mg/dL (ref 15–45)

## 2021-07-11 LAB — OLIGOCLONAL BANDS, CSF + SERM

## 2021-07-11 LAB — GLUCOSE, CSF: Glucose, CSF: 88 mg/dL — ABNORMAL HIGH (ref 40–80)

## 2021-07-11 LAB — MYELIN BASIC PROTEIN, CSF: Myelin Basic Protein: <2 mcg/L (ref <=4.0)

## 2021-07-15 DIAGNOSIS — R202 Paresthesia of skin: Secondary | ICD-10-CM | POA: Diagnosis not present

## 2021-07-15 DIAGNOSIS — R531 Weakness: Secondary | ICD-10-CM | POA: Diagnosis not present

## 2021-07-15 DIAGNOSIS — M79602 Pain in left arm: Secondary | ICD-10-CM | POA: Diagnosis not present

## 2021-07-15 DIAGNOSIS — M25512 Pain in left shoulder: Secondary | ICD-10-CM | POA: Diagnosis not present

## 2021-07-15 DIAGNOSIS — M79632 Pain in left forearm: Secondary | ICD-10-CM | POA: Diagnosis not present

## 2021-07-19 ENCOUNTER — Ambulatory Visit: Payer: BC Managed Care – PPO | Admitting: Neurology

## 2021-07-19 ENCOUNTER — Other Ambulatory Visit: Payer: Self-pay

## 2021-07-19 ENCOUNTER — Encounter: Payer: Self-pay | Admitting: Neurology

## 2021-07-19 VITALS — BP 123/85 | HR 73 | Ht 66.0 in | Wt 269.4 lb

## 2021-07-19 DIAGNOSIS — M25512 Pain in left shoulder: Secondary | ICD-10-CM | POA: Diagnosis not present

## 2021-07-19 DIAGNOSIS — R29898 Other symptoms and signs involving the musculoskeletal system: Secondary | ICD-10-CM | POA: Diagnosis not present

## 2021-07-19 DIAGNOSIS — M79632 Pain in left forearm: Secondary | ICD-10-CM | POA: Diagnosis not present

## 2021-07-19 DIAGNOSIS — R202 Paresthesia of skin: Secondary | ICD-10-CM | POA: Diagnosis not present

## 2021-07-19 DIAGNOSIS — R531 Weakness: Secondary | ICD-10-CM | POA: Diagnosis not present

## 2021-07-19 DIAGNOSIS — M79602 Pain in left arm: Secondary | ICD-10-CM | POA: Diagnosis not present

## 2021-07-19 MED ORDER — GABAPENTIN 300 MG PO CAPS: 300.0000 mg | ORAL_CAPSULE | Freq: Every day | ORAL | 3 refills | Status: DC

## 2021-07-19 NOTE — Patient Instructions (Signed)
Start gabapentin 300mg  at bedtime  Continue physical therapy  Return to clinic in 3 months

## 2021-07-19 NOTE — Progress Notes (Signed)
Follow-up Visit   Date: 07/19/21   Tina Mayer MRN: [id number] DOB: September 28, 1985   Interim History: Tina Mayer is a 35 y.o. right-handed African American female with hypertension returning to the clinic for follow-up of left arm weakness.  The patient was accompanied to the clinic by self.  History of present illness: She woke up on Sunday, 06/02/2021 and realized that she was unable to raise her left hand and arm.  She felt as if he was numb and was unable to grasp her door handle.  Her arm was essentially limp with no movement from the upper arm into her fingers.  She went to the ER and had MRI brain and cervical spine.  MRI brain was negative for stroke.  There was a questionable cord hyperintensity at C3-4 on the right, which upon my review is not prominent and would not explain her left sided symptoms.  She was started on prednisone taper pack and feels that her weakness has improved because she is able to supinate her arm and flex her fingers.  She is not able to raise her arm, bend at the elbow, or flex at the wrist.  She has tingling over the fingertips.  She had COVID during the end of August manifesting with cough and fatigue.  She did not need any treatment.   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   UPDATE 07/19/2021:  She underwent EMG and CSF testing since her last visit.  EMG was suggestive of C5-6 radicular process, CSF returned normal.  She began noticing steady improvement over the last several weeks, most notably over the past 4 days.  She is able to raise her left arm better and has better strength in her arms.  She continues to have stiffness sensation and tingling over the forearm and the base of the thumb and fingers.  Typing is difficult because of the numbness and stiffness in the arm.   Medications:  Current Outpatient Medications on File Prior to Visit   Medication Sig Dispense Refill   ferrous sulfate 325 (65 FE) MG EC tablet Take 325 mg by mouth 3 (three) times daily with meals.     INOSITOL PO Take 8 mg by mouth daily.     pyridoxine (B-6) 250 MG tablet Take 250 mg by mouth daily.     Vitamin D, Ergocalciferol, (DRISDOL) 1.25 MG (50000 UNIT) CAPS capsule Take 1 capsule (50,000 Units total) by mouth every 7 (seven) days. 12 capsule 0   amLODipine-benazepril (LOTREL) 5-10 MG capsule Take 1 capsule by mouth daily. (Patient not taking: Reported on 07/19/2021) 90 capsule 0   hydrochlorothiazide (HYDRODIURIL) 12.5 MG tablet Take 1 tablet (12.5 mg total) by mouth daily. 60 tablet 0   predniSONE (STERAPRED UNI-PAK 21 TAB) 10 MG (21) TBPK tablet Take by mouth daily. Take 6 tabs by mouth daily  for 2 days, then 5 tabs for 2 days, then 4 tabs for 2 days, then 3 tabs for 2 days, 2 tabs for 2 days, then 1 tab by mouth daily for 2 days 42 tablet 0   vitamin B-12 (CYANOCOBALAMIN) 1000 MCG tablet Take 500 mcg by mouth daily.     No current facility-administered medications on file prior to visit.    Allergies:  Allergies  Allergen Reactions   Penicillins Rash    Has patient had a PCN reaction causing immediate rash, facial/tongue/throat swelling,  SOB or lightheadedness with hypotension: No Has patient had a PCN reaction causing severe rash involving mucus membranes or skin necrosis: No Has patient had a PCN reaction that required hospitalization: No Has patient had a PCN reaction occurring within the last 10 years: No If all of the above answers are "NO", then may proceed with Cephalosporin use.    Vital Signs:  BP 123/85   Pulse 73   Ht 5\' 6"  (1.676 m)   Wt 269 lb 6.4 oz (122.2 kg)   SpO2 99%   BMI 43.48 kg/m   Neurological Exam: MENTAL STATUS including orientation to time, place, person, recent and remote memory, attention span and concentration, language, and fund of knowledge is normal.  Speech is not dysarthric.  CRANIAL NERVES:    Pupils equal round and reactive to light.  Normal conjugate, extra-ocular eye movements in all directions of gaze.  No ptosis.    MOTOR:  No atrophy, fasciculations or abnormal movements.  No pronator drift.  Left shoulder ROM is limited, unable to fully extend Upper Extremity:  Right  Left  Deltoid  5/5   5-/5 *  Biceps  5/5   5/5 *  Triceps  5/5   5/5   Infraspinatus 5/5  5/5  Medial pectoralis 5/5  5/5  Wrist extensors  5/5   5-/5   Wrist flexors  5/5   5-/5   Finger extensors  5/5   5-/5   Finger flexors  5/5   5-/5   Dorsal interossei  5/5   5-/5   Abductor pollicis  5/5   5-/5   Tone (Ashworth scale)  0  0   Lower Extremity:  Right  Left  Hip flexors  5/5   5/5   Hip extensors  5/5   5/5   Adductor 5/5  5/5  Abductor 5/5  5/5  Knee flexors  5/5   5/5   Knee extensors  5/5   5/5   Dorsiflexors  5/5   5/5   Plantarflexors  5/5   5/5   Toe extensors  5/5   5/5   Toe flexors  5/5   5/5   Tone (Ashworth scale)  0  0   MSRs:  Reflexes are 2+/4 throughout .  SENSORY:  Intact to vibration throughout.  Temperature reduced over the palm of the left hand.   COORDINATION/GAIT:  Normal finger-to- nose-finger.  Intact rapid alternating movements bilaterally.  Gait narrow based and stable.   Data: MRI cervical spine wwo contrast 06/02/2021: 1. Motion limited evaluation of the cord. Question subtle T2 hyperintensity within the right lateral cord at C3-C4 and possible subtle cord signal hyperintensity at C5-C6 versus artifact. No abnormal cord enhancement. 2. Small posterior disc osteophyte complex at C6-C7 contacts the left ventral cord without significant canal or foraminal stenosis.   MRI brain wwo contrast 06/02/2021: 1. No evidence of acute intracranial abnormality. 2. 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  NCS/EMG of the left arm 06/26/2021: The electrophysiologic findings active on chronic motor axonal loss changes involving the C5-6 nerve roots/segments  (severe) and significantly less chronic changes involving the C8 nerve root/segments.  Although a patchy brachial plexopathy could also manifest with a similar pattern, the presence of normal sensory responses makes this less likely.  Correlate clinically.   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  EMG shows neurogenic changes involving the C5-6 nerve roots, less so in C8 with normal sensory responses. Normal sensory responses makes brachial plexopathy less likely.  No evidence of polyradiculoneuropathy on CSF testing.   MRI cervical spine from 9/11 did not show any specific nerve root pathology to explain this.  Serology testing has also been extensive and largely unremarkable.  Clinically, she is showing marked improvement with muscle strength in the left arm.  She has limited ROM with shoulder extension and abduction and continues to have numbness in the hand, otherwise, making good recovery.   At this juncture, I recommend that she continue PT and monitor symptoms. If there is any new weakness, MRI cervical spine wwo contrast can be repeated.  I encouraged her to continue to try to type. She will update me in the next few weeks to determine if she is return to work or will need extended time for recovery Start gabapentin 300mg  at bedtime  Return to clinic in 3 months.   Total time spent reviewing records, interview, history/exam, documentation, counseling, and coordination of care on day of encounter:  30 min   Thank you for allowing me to participate in patient's care.  If I can answer any additional questions, I would be pleased to do so.    Sincerely,    Nyheem Binette K. , DO

## 2021-08-02 DIAGNOSIS — M25512 Pain in left shoulder: Secondary | ICD-10-CM | POA: Diagnosis not present

## 2021-08-02 DIAGNOSIS — R531 Weakness: Secondary | ICD-10-CM | POA: Diagnosis not present

## 2021-08-02 DIAGNOSIS — M79632 Pain in left forearm: Secondary | ICD-10-CM | POA: Diagnosis not present

## 2021-08-02 DIAGNOSIS — M79602 Pain in left arm: Secondary | ICD-10-CM | POA: Diagnosis not present

## 2021-08-02 DIAGNOSIS — R202 Paresthesia of skin: Secondary | ICD-10-CM | POA: Diagnosis not present

## 2021-08-09 ENCOUNTER — Ambulatory Visit: Payer: BC Managed Care – PPO | Admitting: Neurology

## 2021-08-14 DIAGNOSIS — M79632 Pain in left forearm: Secondary | ICD-10-CM | POA: Diagnosis not present

## 2021-08-14 DIAGNOSIS — R531 Weakness: Secondary | ICD-10-CM | POA: Diagnosis not present

## 2021-08-14 DIAGNOSIS — M79602 Pain in left arm: Secondary | ICD-10-CM | POA: Diagnosis not present

## 2021-08-14 DIAGNOSIS — M25512 Pain in left shoulder: Secondary | ICD-10-CM | POA: Diagnosis not present

## 2021-08-14 DIAGNOSIS — R202 Paresthesia of skin: Secondary | ICD-10-CM | POA: Diagnosis not present

## 2021-08-23 DIAGNOSIS — R531 Weakness: Secondary | ICD-10-CM | POA: Diagnosis not present

## 2021-08-23 DIAGNOSIS — R202 Paresthesia of skin: Secondary | ICD-10-CM | POA: Diagnosis not present

## 2021-08-23 DIAGNOSIS — M79632 Pain in left forearm: Secondary | ICD-10-CM | POA: Diagnosis not present

## 2021-08-23 DIAGNOSIS — M25512 Pain in left shoulder: Secondary | ICD-10-CM | POA: Diagnosis not present

## 2021-08-23 DIAGNOSIS — M79602 Pain in left arm: Secondary | ICD-10-CM | POA: Diagnosis not present

## 2021-09-03 ENCOUNTER — Encounter: Payer: Self-pay | Admitting: Registered Nurse

## 2021-09-03 NOTE — Telephone Encounter (Signed)
FYI from pt,  States doe not need further testing Neurology determined this was muscles wrapped around nerve causing weakness

## 2021-09-10 ENCOUNTER — Other Ambulatory Visit: Payer: Self-pay

## 2021-09-10 ENCOUNTER — Ambulatory Visit (INDEPENDENT_AMBULATORY_CARE_PROVIDER_SITE_OTHER): Payer: BC Managed Care – PPO | Admitting: Registered Nurse

## 2021-09-10 ENCOUNTER — Encounter: Payer: Self-pay | Admitting: Neurology

## 2021-09-10 ENCOUNTER — Encounter: Payer: Self-pay | Admitting: Registered Nurse

## 2021-09-10 VITALS — BP 155/84 | HR 84 | Temp 98.1°F | Resp 18 | Ht 66.0 in | Wt 276.0 lb

## 2021-09-10 DIAGNOSIS — Z6841 Body Mass Index (BMI) 40.0 and over, adult: Secondary | ICD-10-CM

## 2021-09-10 DIAGNOSIS — M25512 Pain in left shoulder: Secondary | ICD-10-CM

## 2021-09-10 MED ORDER — SEMAGLUTIDE (1 MG/DOSE) 4 MG/3ML ~~LOC~~ SOPN: 1.0000 mg | PEN_INJECTOR | SUBCUTANEOUS | 0 refills | Status: DC

## 2021-09-10 MED ORDER — CYCLOBENZAPRINE HCL 10 MG PO TABS
10.0000 mg | ORAL_TABLET | Freq: Three times a day (TID) | ORAL | 0 refills | Status: DC | PRN
Start: 2021-09-10 — End: 2022-06-25

## 2021-09-10 MED ORDER — TRAMADOL HCL 50 MG PO TABS
50.0000 mg | ORAL_TABLET | Freq: Three times a day (TID) | ORAL | 0 refills | Status: AC | PRN
Start: 2021-09-10 — End: 2021-09-15

## 2021-09-10 MED ORDER — PREDNISONE 10 MG PO TABS: ORAL_TABLET | ORAL | 0 refills | Status: AC

## 2021-09-10 MED ORDER — DICLOFENAC SODIUM 75 MG PO TBEC: 75.0000 mg | DELAYED_RELEASE_TABLET | Freq: Two times a day (BID) | ORAL | 0 refills | Status: DC

## 2021-09-10 MED ORDER — SEMAGLUTIDE(0.25 OR 0.5MG/DOS) 2 MG/1.5ML ~~LOC~~ SOPN: 0.5000 mg | PEN_INJECTOR | SUBCUTANEOUS | 0 refills | Status: DC

## 2021-09-10 NOTE — Patient Instructions (Addendum)
Ms. Laury Deep -  Randie Heinz to see you  In brief: Tylenol - 1000mg  three times daily as needed (pain relief, antiinflammatory) Diclofenac - 75mg  twice daily as needed (pain relief, antiinflammatory) (do not take with ibuprofen, naproxen, NSAIDs) Cyclobenzaprine - 5-10mg  three times daily as needed (muscle relaxer) (may make you sleepy) Tramadol - 50mg   - twice daily as needed (pain relief) (may make you sleepy) Prednisone - taper - take as directed (antiinflammatory)  We will work to get semaglutide approved  Call if you need anything!  Thanks,  Rich     If you have lab work done today you will be contacted with your lab results within the next 2 weeks.  If you have not heard from then please contact . The fastest way to get your results is to register for My Chart.   IF you received an x-ray today, you will receive an invoice from [facility]. Please contact [facility] at [phone] with questions or concerns regarding your invoice.   IF you received labwork today, you will receive an invoice from [facility]. Please contact [facility] at [phone] with questions or concerns regarding your invoice.   Our billing staff will not be able to assist you with questions regarding bills from these companies.  You will be contacted with the lab results as soon as they are available. The fastest way to get your results is to activate your My Chart account. Instructions are located on the last page of this paperwork. If you have not heard from [phone] regarding the results in 2 weeks, please contact this office.

## 2021-09-10 NOTE — Progress Notes (Signed)
Established Patient Office Visit  Subjective:  Patient ID: Tina Mayer, female    DOB: November 21, 1985  Age: 35 y.o. MRN: [id number]  CC:  Chief Complaint  Patient presents with   Follow-up    Patient states she is here for follow up on left side weakness that has just started back.    HPI Tina Mayer presents for follow up   L side weakness Sudden onset on 06/02/21. Work up has been extensive and largely negative in imaging, emg, and serology. Seen by Dr. Allena Katz who encouraged her to continue PT and suggested MRI wo of cervical spine with any new weakness.  She has recently returned to work. Unfortunately had had symptoms return. Less severe than initial onset. Tightness in trapezius muscle.  Thinks returning to work may have something to do with return of symptoms. Has continued with physical therapy.   Past Medical History:  Diagnosis Date   Asthma    Hypertension     Past Surgical History:  Procedure Laterality Date   CHOLECYSTECTOMY     TUBAL LIGATION      Family History  Problem Relation Age of Onset   Hypertension Mother    Testicular cancer Father    Diabetes Maternal Grandmother    Hypertension Maternal Grandmother     Social History   Socioeconomic History   Marital status: Divorced    Spouse name: Not on file   Number of children: 3   Years of education: Not on file   Highest education level: Not on file  Occupational History   Not on file  Tobacco Use   Smoking status: Never   Smokeless tobacco: Never  Substance and Sexual Activity   Alcohol use: Yes    Comment: occ   Drug use: Never   Sexual activity: Not on file  Other Topics Concern   Not on file  Social History Narrative   Right Handed    Lives in a one story home    Social Determinants of Health   Financial Resource Strain: Not on file  Food Insecurity: Not on file  Transportation Needs: Not on file  Physical Activity: Not on file  Stress: Not on file   Social Connections: Not on file  Intimate Partner Violence: Not on file    Outpatient Medications Prior to Visit  Medication Sig Dispense Refill   ferrous sulfate 325 (65 FE) MG EC tablet Take 325 mg by mouth 3 (three) times daily with meals.     gabapentin (NEURONTIN) 300 MG capsule Take 1 capsule (300 mg total) by mouth at bedtime. 30 capsule 3   INOSITOL PO Take 8 mg by mouth daily.     pyridoxine (B-6) 250 MG tablet Take 250 mg by mouth daily.     Vitamin D, Ergocalciferol, (DRISDOL) 1.25 MG (50000 UNIT) CAPS capsule Take 1 capsule (50,000 Units total) by mouth every 7 (seven) days. 12 capsule 0   hydrochlorothiazide (HYDRODIURIL) 12.5 MG tablet Take 1 tablet (12.5 mg total) by mouth daily. 60 tablet 0   vitamin B-12 (CYANOCOBALAMIN) 1000 MCG tablet Take 500 mcg by mouth daily.     amLODipine-benazepril (LOTREL) 5-10 MG capsule Take 1 capsule by mouth daily. (Patient not taking: Reported on 07/19/2021) 90 capsule 0   predniSONE (STERAPRED UNI-PAK 21 TAB) 10 MG (21) TBPK tablet Take by mouth daily. Take 6 tabs by mouth daily  for 2 days, then 5 tabs for 2 days, then 4 tabs for 2 days, then 3 tabs  for 2 days, 2 tabs for 2 days, then 1 tab by mouth daily for 2 days 42 tablet 0   No facility-administered medications prior to visit.    Allergies  Allergen Reactions   Penicillins Rash    Has patient had a PCN reaction causing immediate rash, facial/tongue/throat swelling, SOB or lightheadedness with hypotension: No Has patient had a PCN reaction causing severe rash involving mucus membranes or skin necrosis: No Has patient had a PCN reaction that required hospitalization: No Has patient had a PCN reaction occurring within the last 10 years: No If all of the above answers are "NO", then may proceed with Cephalosporin use.    ROS Review of Systems    Objective:    Physical Exam  BP (!) 155/84    Pulse 84    Temp 98.1 F (36.7 C) (Temporal)    Resp 18    Ht 5\' 6"  (1.676 m)    Wt  276 lb (125.2 kg)    SpO2 99%    BMI 44.55 kg/m  Wt Readings from Last 3 Encounters:  09/10/21 276 lb (125.2 kg)  07/19/21 269 lb 6.4 oz (122.2 kg)  06/14/21 266 lb (120.7 kg)     Health Maintenance Due  Topic Date Due   COVID-19 Vaccine (1) Never done   Pneumococcal Vaccine 2-27 Years old (1 - PCV) Never done   PAP SMEAR-Modifier  06/22/2021    There are no preventive care reminders to display for this patient.  Lab Results  Component Value Date   TSH 0.71 06/12/2021   Lab Results  Component Value Date   WBC 9.6 06/12/2021   HGB 12.1 06/12/2021   HCT 37.2 06/12/2021   MCV 83.8 06/12/2021   PLT 255.0 06/12/2021   Lab Results  Component Value Date   NA 136 06/07/2021   K 3.7 06/07/2021   CO2 23 06/07/2021   GLUCOSE 144 (H) 06/07/2021   BUN 11 06/07/2021   CREATININE 0.65 06/07/2021   BILITOT 0.5 06/02/2021   ALKPHOS 70 06/02/2021   AST 21 06/02/2021   ALT 20 06/02/2021   PROT 7.4 06/02/2021   ALBUMIN 4.0 07/03/2021   CALCIUM 9.3 06/07/2021   ANIONGAP 10 06/07/2021   Lab Results  Component Value Date   CHOL 176 06/12/2021   Lab Results  Component Value Date   HDL 46.80 06/12/2021   Lab Results  Component Value Date   LDLCALC 110 (H) 06/12/2021   Lab Results  Component Value Date   TRIG 97.0 06/12/2021   Lab Results  Component Value Date   CHOLHDL 4 06/12/2021   Lab Results  Component Value Date   HGBA1C 6.3 06/12/2021      Assessment & Plan:   Problem List Items Addressed This Visit   None Visit Diagnoses     Acute pain of left shoulder    -  Primary   Relevant Medications   diclofenac (VOLTAREN) 75 MG EC tablet   cyclobenzaprine (FLEXERIL) 10 MG tablet   Class 3 severe obesity due to excess calories without serious comorbidity with body mass index (BMI) of 40.0 to 44.9 in adult (HCC)       Relevant Medications   Semaglutide,0.25 or 0.5MG /DOS, 2 MG/1.5ML SOPN   Semaglutide, 1 MG/DOSE, 4 MG/3ML SOPN       Meds ordered this  encounter  Medications   Semaglutide,0.25 or 0.5MG /DOS, 2 MG/1.5ML SOPN    Sig: Inject 0.5 mg into the skin once a week.  Dispense:  2 mL    Refill:  0    Order Specific Question:   Supervising Provider    Answer:   Neva Seat, JEFFREY R [[id number]]   Semaglutide, 1 MG/DOSE, 4 MG/3ML SOPN    Sig: Inject 1 mg as directed once a week.    Dispense:  9 mL    Refill:  0    Order Specific Question:   Supervising Provider    Answer:   Neva Seat, JEFFREY R [[id number]]   diclofenac (VOLTAREN) 75 MG EC tablet    Sig: Take 1 tablet (75 mg total) by mouth 2 (two) times daily.    Dispense:  30 tablet    Refill:  0    Order Specific Question:   Supervising Provider    Answer:   Neva Seat, JEFFREY R [[id number]]   cyclobenzaprine (FLEXERIL) 10 MG tablet    Sig: Take 1 tablet (10 mg total) by mouth 3 (three) times daily as needed for muscle spasms.    Dispense:  30 tablet    Refill:  0    Order Specific Question:   Supervising Provider    Answer:   Neva Seat, JEFFREY R [[id number]]   traMADol (ULTRAM) 50 MG tablet    Sig: Take 1 tablet (50 mg total) by mouth every 8 (eight) hours as needed for up to 5 days.    Dispense:  15 tablet    Refill:  0    Order Specific Question:   Supervising Provider    Answer:   Neva Seat, JEFFREY R [[id number]]   predniSONE (DELTASONE) 10 MG tablet    Sig: Take 6 tablets (60 mg total) by mouth daily with breakfast for 2 days, THEN 5 tablets (50 mg total) daily with breakfast for 2 days, THEN 4 tablets (40 mg total) daily with breakfast for 2 days, THEN 3 tablets (30 mg total) daily with breakfast for 2 days, THEN 2 tablets (20 mg total) daily with breakfast for 2 days, THEN 1 tablet (10 mg total) daily with breakfast for 2 days.    Dispense:  42 tablet    Refill:  0    Order Specific Question:   Supervising Provider    Answer:   Neva Seat, JEFFREY R [[id number]]    Follow-up: Return in about 3 months (around 12/09/2021) for semaglutide med check.   PLAN Tramadol and flexeril for pain Will send semaglutide for  weight management Discussed nonpharm relief. Follow up in 3-6 mo for CPE and lab Continue following with neuro Patient encouraged to call clinic with any questions, comments, or concerns.  Janeece Agee, NP

## 2021-09-11 ENCOUNTER — Telehealth: Payer: Self-pay | Admitting: Registered Nurse

## 2021-09-11 NOTE — Telephone Encounter (Signed)
Received. Placed in provider's folder °

## 2021-09-11 NOTE — Telephone Encounter (Signed)
I have place form from [employer] in the bin upfront with a charge sheet

## 2021-09-12 ENCOUNTER — Other Ambulatory Visit: Payer: Self-pay | Admitting: Registered Nurse

## 2021-09-12 DIAGNOSIS — I10 Essential (primary) hypertension: Secondary | ICD-10-CM

## 2021-09-12 NOTE — Telephone Encounter (Signed)
Attempted to call patient about the paperwork she needs for work. LVM to return call

## 2021-09-13 NOTE — Telephone Encounter (Signed)
Pt returned phone call from [place]

## 2021-09-17 NOTE — Telephone Encounter (Signed)
I have spoke with the patient to let her know that the medication was received and will be faxed when completed .

## 2021-09-17 NOTE — Telephone Encounter (Signed)
Called and spoke with patient to let her know that we did receive her FMLA paperwork and will have them faxed to her employers.

## 2021-09-17 NOTE — Telephone Encounter (Signed)
Pt returned phone call to check on  FMLA forms

## 2021-09-25 DIAGNOSIS — R531 Weakness: Secondary | ICD-10-CM | POA: Diagnosis not present

## 2021-09-25 DIAGNOSIS — R202 Paresthesia of skin: Secondary | ICD-10-CM | POA: Diagnosis not present

## 2021-09-25 DIAGNOSIS — M25512 Pain in left shoulder: Secondary | ICD-10-CM | POA: Diagnosis not present

## 2021-09-25 DIAGNOSIS — M79602 Pain in left arm: Secondary | ICD-10-CM | POA: Diagnosis not present

## 2021-09-25 DIAGNOSIS — M79632 Pain in left forearm: Secondary | ICD-10-CM | POA: Diagnosis not present

## 2021-09-26 ENCOUNTER — Telehealth: Payer: Self-pay | Admitting: Registered Nurse

## 2021-09-26 LAB — HM DIABETES EYE EXAM

## 2021-09-26 NOTE — Telephone Encounter (Signed)
Patients office notes have been faxed to [employer].

## 2021-09-26 NOTE — Telephone Encounter (Signed)
Pt called in stating that she needs the office notes from [employer]: 09/10/21 faxed to [phone], they needs these to complete her FMLA forms.   Pt also said that she and Rich discussed a CT or a MRI on her neck.   Office notes not signed off on and no orders have been placed please advise

## 2021-09-30 ENCOUNTER — Encounter: Payer: Self-pay | Admitting: Family Medicine

## 2021-09-30 ENCOUNTER — Telehealth: Payer: Self-pay | Admitting: Registered Nurse

## 2021-09-30 NOTE — Telephone Encounter (Signed)
I have placed forms from [employer] in the bin upfront with a charge sheet

## 2021-10-02 NOTE — Telephone Encounter (Signed)
Patient's paperwork has been posted in the back bin to be completed

## 2021-10-03 NOTE — Telephone Encounter (Signed)
Forms have been faxed in and sent to [employer]

## 2021-10-09 DIAGNOSIS — R531 Weakness: Secondary | ICD-10-CM | POA: Diagnosis not present

## 2021-10-09 DIAGNOSIS — M79632 Pain in left forearm: Secondary | ICD-10-CM | POA: Diagnosis not present

## 2021-10-09 DIAGNOSIS — R202 Paresthesia of skin: Secondary | ICD-10-CM | POA: Diagnosis not present

## 2021-10-09 DIAGNOSIS — M79602 Pain in left arm: Secondary | ICD-10-CM | POA: Diagnosis not present

## 2021-10-09 DIAGNOSIS — M25512 Pain in left shoulder: Secondary | ICD-10-CM | POA: Diagnosis not present

## 2021-10-15 ENCOUNTER — Other Ambulatory Visit: Payer: Self-pay | Admitting: Registered Nurse

## 2021-10-15 DIAGNOSIS — E559 Vitamin D deficiency, unspecified: Secondary | ICD-10-CM

## 2021-10-22 ENCOUNTER — Telehealth: Payer: Self-pay | Admitting: Registered Nurse

## 2021-10-22 NOTE — Telephone Encounter (Signed)
I have placed forms in the bin upfront with a charge sheet from [employer]

## 2021-10-23 NOTE — Telephone Encounter (Signed)
I have placed this in your folder in the back to be signed.

## 2021-10-24 NOTE — Progress Notes (Signed)
Follow-up Visit   Date: 10/24/21   Tina Mayer MRN: [id number] DOB: 29-Jan-1986   Interim History: Tina Mayer is a 36 y.o. right-handed African American female with hypertension returning to the clinic for follow-up of left arm weakness.  The patient was accompanied to the clinic by self.  History of present illness: She woke up on Sunday, 06/02/2021 and realized that she was unable to raise her left hand and arm.  She felt as if he was numb and was unable to grasp her door handle.  Her arm was essentially limp with no movement from the upper arm into her fingers.  She went to the ER and had MRI brain and cervical spine.  MRI brain was negative for stroke.  There was a questionable cord hyperintensity at C3-4 on the right, which upon my review is not prominent and would not explain her left sided symptoms.  She was started on prednisone taper pack and feels that her weakness has improved because she is able to supinate her arm and flex her fingers.  She is not able to raise her arm, bend at the elbow, or flex at the wrist.  She has tingling over the fingertips.  She had COVID during the end of August manifesting with cough and fatigue.  She did not need any treatment.   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   UPDATE 07/19/2021:  She underwent EMG and CSF testing since her last visit.  EMG was suggestive of C5-6 radicular process, CSF returned normal.  She began noticing steady improvement over the last several weeks, most notably over the past 4 days.  She is able to raise her left arm better and has better strength in her arms.  She continues to have stiffness sensation and tingling over the forearm and the base of the thumb and fingers.  Typing is difficult because of the numbness and stiffness in the arm.   UPDATE 10/25/2021: She is here for follow-up visit.  She returned to work for  3-4 days and then developed left sided neck and shoulder pain.  She complains of achy pain over the left shoulder and takes flexeril 10mg  and gabapentin 300mg  at bedtime. Her pain is slowly improving with PT.  Her PCP has kept her out of work.  From a neurological standpoint, she denies any new weakness, numbness, or tingling of the left arm.  Specifically, she is able to feel keys when typing.   Medications:  Current Outpatient Medications on File Prior to Visit  Medication Sig Dispense Refill   amLODipine-benazepril (LOTREL) 5-10 MG capsule TAKE 1 CAPSULE BY MOUTH EVERY DAY 90 capsule 0   cyclobenzaprine (FLEXERIL) 10 MG tablet Take 1 tablet (10 mg total) by mouth 3 (three) times daily as needed for muscle spasms. 30 tablet 0   diclofenac (VOLTAREN) 75 MG EC tablet Take 1 tablet (75 mg total) by mouth 2 (two) times daily. 30 tablet 0   ferrous sulfate 325 (65 FE) MG EC tablet Take 325 mg by mouth 3 (three) times daily with meals.     gabapentin (NEURONTIN) 300 MG capsule Take 1 capsule (300 mg total) by mouth at bedtime. 30 capsule 3   hydrochlorothiazide (HYDRODIURIL) 12.5 MG tablet Take 1 tablet (12.5 mg total) by mouth daily. 60 tablet 0   INOSITOL PO Take 8 mg by mouth daily.     pyridoxine (  B-6) 250 MG tablet Take 250 mg by mouth daily.     Semaglutide, 1 MG/DOSE, 4 MG/3ML SOPN Inject 1 mg as directed once a week. 9 mL 0   Semaglutide,0.25 or 0.5MG /DOS, 2 MG/1.5ML SOPN Inject 0.5 mg into the skin once a week. 2 mL 0   vitamin B-12 (CYANOCOBALAMIN) 1000 MCG tablet Take 500 mcg by mouth daily.     Vitamin D, Ergocalciferol, (DRISDOL) 1.25 MG (50000 UNIT) CAPS capsule TAKE 1 CAPSULE (50,000 UNITS TOTAL) BY MOUTH EVERY 7 (SEVEN) DAYS 4 capsule 2   No current facility-administered medications on file prior to visit.    Allergies:  Allergies  Allergen Reactions   Penicillins Rash    Has patient had a PCN reaction causing immediate rash, facial/tongue/throat swelling, SOB or lightheadedness  with hypotension: No Has patient had a PCN reaction causing severe rash involving mucus membranes or skin necrosis: No Has patient had a PCN reaction that required hospitalization: No Has patient had a PCN reaction occurring within the last 10 years: No If all of the above answers are "NO", then may proceed with Cephalosporin use.    Vital Signs:  There were no vitals taken for this visit.  Neurological Exam: MENTAL STATUS including orientation to time, place, person, recent and remote memory, attention span and concentration, language, and fund of knowledge is normal.  Speech is not dysarthric.  CRANIAL NERVES:   Pupils equal round and reactive to light.  Normal conjugate, extra-ocular eye movements in all directions of gaze.  No ptosis.    MOTOR:  No atrophy, fasciculations or abnormal movements.  No pronator drift.  Left shoulder ROM significantly improved able to fully extend Upper Extremity:  Right  Left  Deltoid  5/5   5/5  Biceps  5/5   5/5  Triceps  5/5   5/5   Infraspinatus 5/5  5/5  Medial pectoralis 5/5  5/5  Wrist extensors  5/5   5/5   Wrist flexors  5/5   5/5   Finger extensors  5/5   5/5   Finger flexors  5/5   5/5   Dorsal interossei  5/5   5/5   Abductor pollicis  5/5   5/5   Tone (Ashworth scale)  0  0   Lower Extremity:  Right  Left  Hip flexors  5/5   5/5   Hip extensors  5/5   5/5   Adductor 5/5  5/5  Abductor 5/5  5/5  Knee flexors  5/5   5/5   Knee extensors  5/5   5/5   Dorsiflexors  5/5   5/5   Plantarflexors  5/5   5/5   Toe extensors  5/5   5/5   Toe flexors  5/5   5/5   Tone (Ashworth scale)  0  0   MSRs:  Reflexes are 2+/4 throughout .  SENSORY:  Intact to vibration throughout.  Temperature and pin prick intact throughout, improved.  COORDINATION/GAIT:   Intact rapid alternating movements bilaterally.  Gait narrow based and stable.   Data: MRI cervical spine wwo contrast 06/02/2021: 1. Motion limited evaluation of the cord. Question  subtle T2 hyperintensity within the right lateral cord at C3-C4 and possible subtle cord signal hyperintensity at C5-C6 versus artifact. No abnormal cord enhancement. 2. Small posterior disc osteophyte complex at C6-C7 contacts the left ventral cord without significant canal or foraminal stenosis.   MRI brain wwo contrast 06/02/2021: 1. No evidence of acute intracranial abnormality. 2.  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  NCS/EMG of the left arm 06/26/2021: The electrophysiologic findings active on chronic motor axonal loss changes involving the C5-6 nerve roots/segments (severe) and significantly less chronic changes involving the C8 nerve root/segments.  Although a patchy brachial plexopathy could also manifest with a similar pattern, the presence of normal sensory responses makes this less likely.  Correlate clinically.   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  EMG shows neurogenic changes involving the C5-6 nerve roots, less so in C8 with normal sensory responses. Normal sensory responses makes brachial plexopathy less likely.  No evidence of polyradiculoneuropathy on CSF testing.   MRI cervical spine from 9/11 did not show any specific nerve root pathology to explain this.  Serology testing has also been extensive and largely unremarkable.  Clinically, she hear near complete recovery from a neurological standpoint.  Her exam shows normal strength, sensation, shoulder range of motion also significantly improved.  Her primary ongoing issue now left shoulder and neck musculoskeletal pain, for which physical therapy and muscle relaxant.  She may stop gabapentin as it is unlikely to help with her muscular pain.  Return to clinic in 8 months.     Thank you for allowing me to participate in patient's care.  If I can answer any additional questions, I would be pleased to do so.    Sincerely,    Mercia Dowe K. Allena Katz, DO

## 2021-10-25 ENCOUNTER — Other Ambulatory Visit: Payer: Self-pay

## 2021-10-25 ENCOUNTER — Encounter: Payer: Self-pay | Admitting: Neurology

## 2021-10-25 ENCOUNTER — Ambulatory Visit: Payer: BC Managed Care – PPO | Admitting: Neurology

## 2021-10-25 VITALS — BP 115/85 | HR 79 | Ht 66.0 in | Wt 260.0 lb

## 2021-10-25 DIAGNOSIS — M542 Cervicalgia: Secondary | ICD-10-CM | POA: Diagnosis not present

## 2021-10-25 DIAGNOSIS — R29898 Other symptoms and signs involving the musculoskeletal system: Secondary | ICD-10-CM

## 2021-10-25 NOTE — Patient Instructions (Signed)
Return to clinic in 8 months 

## 2021-10-29 ENCOUNTER — Encounter: Payer: Self-pay | Admitting: Registered Nurse

## 2021-10-30 NOTE — Telephone Encounter (Signed)
Patient would like to know when is her return to work date. Good afternoon. I wanted to reach out regarding my new return to work date. Mr. Tina Mayer wanted me to follow-up with Dr. Nita Mayer (Neurologist) regarding my arm/neck/muscle issues. Dr. Eliane Mayer office stated that I should have your office provide the final return to work date since the issue is no longer nerves. Tina Mayer will be looking for a return to work date. I had requested 2/14 as a retirn-to-work date since my physical therapy is on 2/13.

## 2021-10-31 ENCOUNTER — Telehealth: Payer: Self-pay | Admitting: Registered Nurse

## 2021-10-31 NOTE — Telephone Encounter (Signed)
I have put the patient paperwork in the back bin  to be signed.

## 2021-10-31 NOTE — Telephone Encounter (Signed)
I have placed FMLA forms in the bin upfront with a charge sheet. These forms are from [facility]

## 2021-11-04 DIAGNOSIS — M79602 Pain in left arm: Secondary | ICD-10-CM | POA: Diagnosis not present

## 2021-11-04 DIAGNOSIS — R202 Paresthesia of skin: Secondary | ICD-10-CM | POA: Diagnosis not present

## 2021-11-04 DIAGNOSIS — R531 Weakness: Secondary | ICD-10-CM | POA: Diagnosis not present

## 2021-11-04 DIAGNOSIS — M79632 Pain in left forearm: Secondary | ICD-10-CM | POA: Diagnosis not present

## 2021-11-04 DIAGNOSIS — M25512 Pain in left shoulder: Secondary | ICD-10-CM | POA: Diagnosis not present

## 2021-11-04 NOTE — Telephone Encounter (Signed)
Please advise on when patient can return to work because she requested  tomorrow but needs to know what you think is appropriate at this time.

## 2021-11-05 NOTE — Telephone Encounter (Signed)
Called this patient but no answer, no voicemail   Will need dates as requested by Tina Mayer below

## 2021-11-05 NOTE — Telephone Encounter (Signed)
Pt needs letter with RTW date

## 2021-11-05 NOTE — Telephone Encounter (Signed)
What dates has she been out, and has there been intermittent leave since, and what is her intended return to work date or when did she return  Thanks,  Retail banker

## 2021-11-05 NOTE — Telephone Encounter (Signed)
Pt called in stating that she needs Richard to provide [employer] with a return to work date. She states that her case is closing today and needs this done asap   Please advise

## 2021-11-06 ENCOUNTER — Telehealth: Payer: Self-pay | Admitting: Registered Nurse

## 2021-11-06 NOTE — Telephone Encounter (Signed)
I have placed FMLA forms in the bin upfront with a charge sheet. These forms are due asap

## 2021-11-06 NOTE — Telephone Encounter (Signed)
Filled out and returned ° °Thanks, ° °Rich

## 2021-11-06 NOTE — Telephone Encounter (Signed)
Sharing this with you so if you hear back from her you are aware what Gerlene Burdock is in need of for her paperwork

## 2021-11-07 NOTE — Telephone Encounter (Signed)
Pt has not returned to work this was a short term leave and she is set to return 11/11/2021 unless you deem otherwise

## 2021-11-08 NOTE — Telephone Encounter (Signed)
Was the FMLA completed and faxed back already?

## 2021-11-08 NOTE — Telephone Encounter (Signed)
11/11/21 is great  Thanks  Rich

## 2021-11-08 NOTE — Telephone Encounter (Signed)
Patients paperwork has been faxed back already

## 2021-12-05 IMAGING — CR DG CHEST 2V
2 series · 2 of 2 positions shown · non-contrast
Comparison: 03/18/2018

CLINICAL DATA: Left upper chest pain, shortness of breath

EXAM:
CHEST - 2 VIEW

[chest pa]
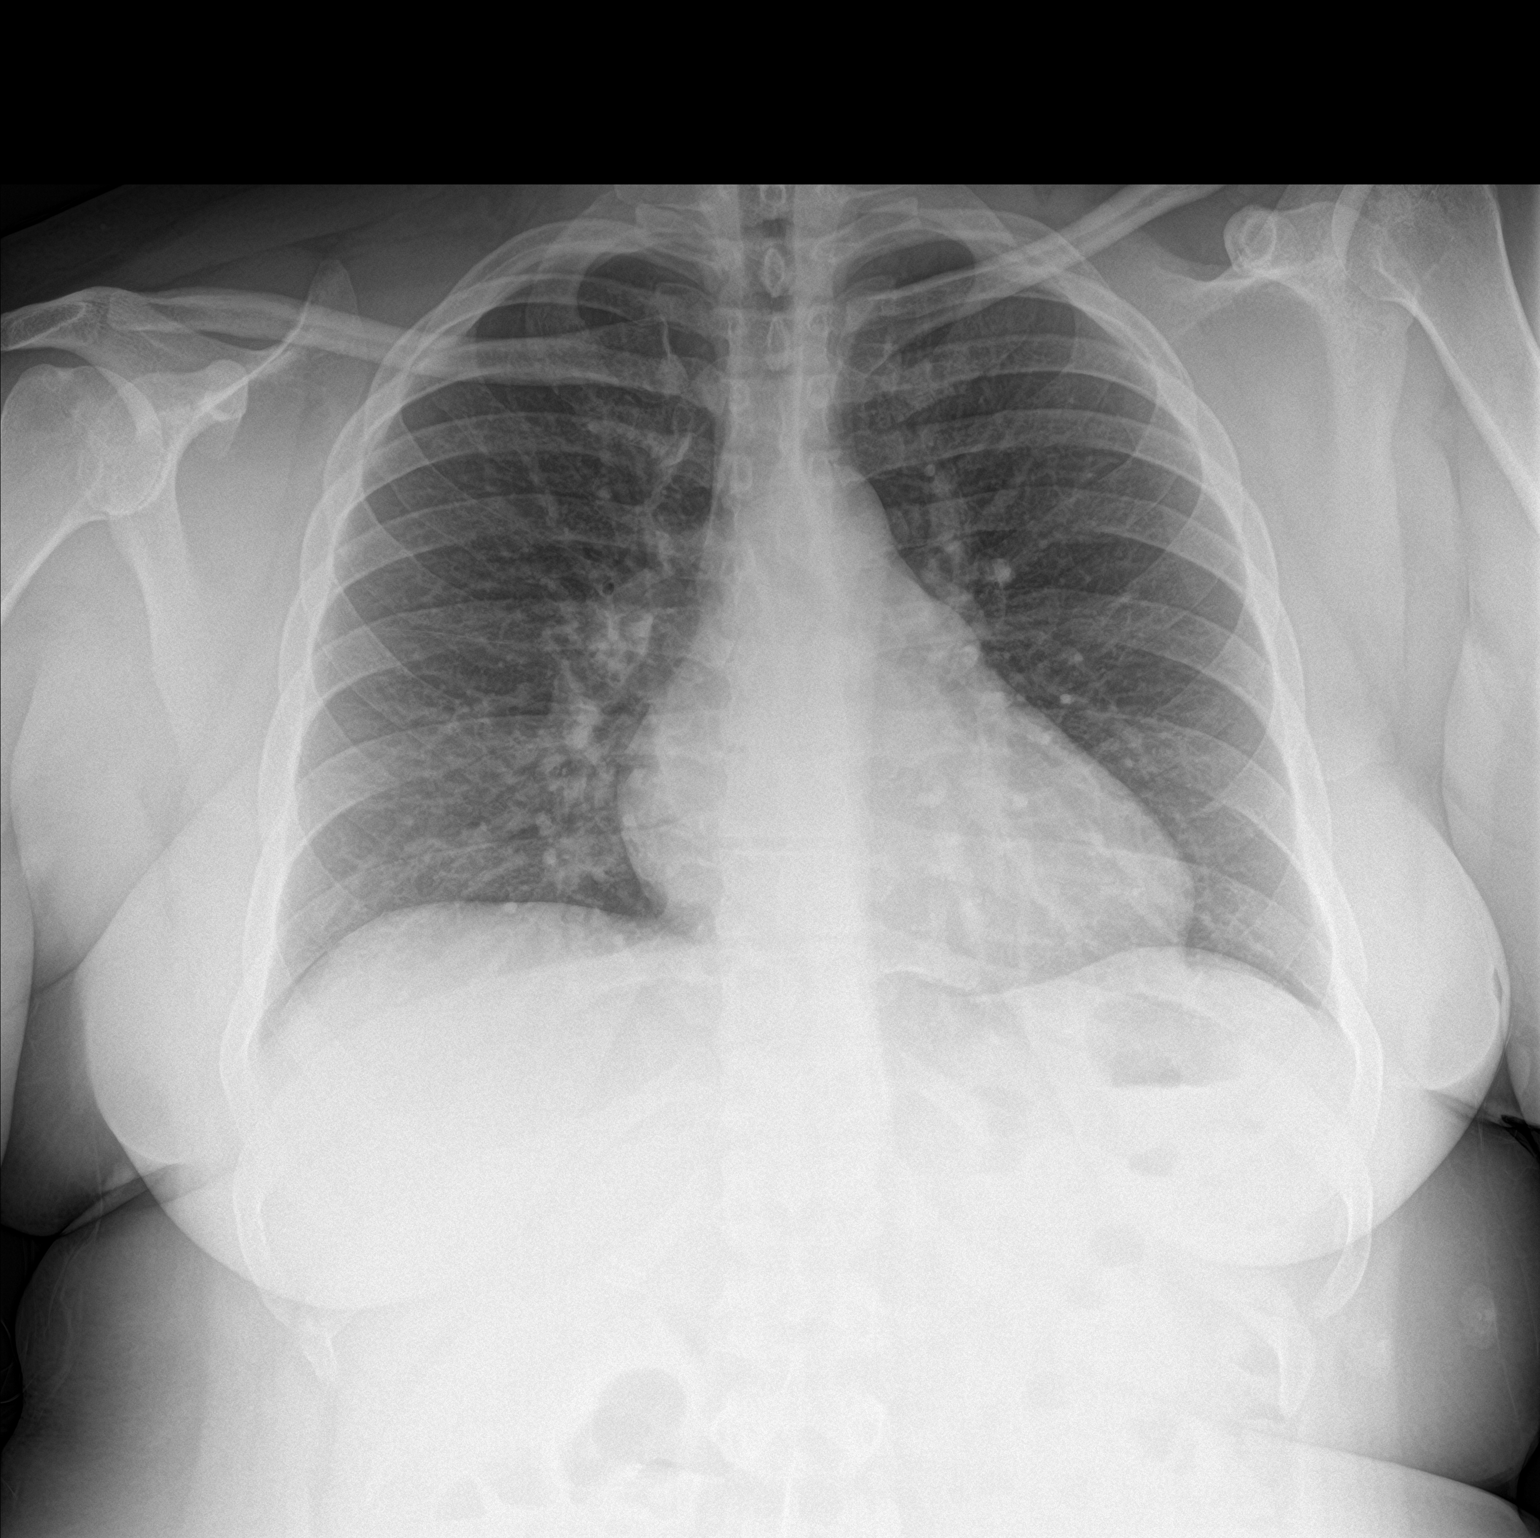

[chest lat]
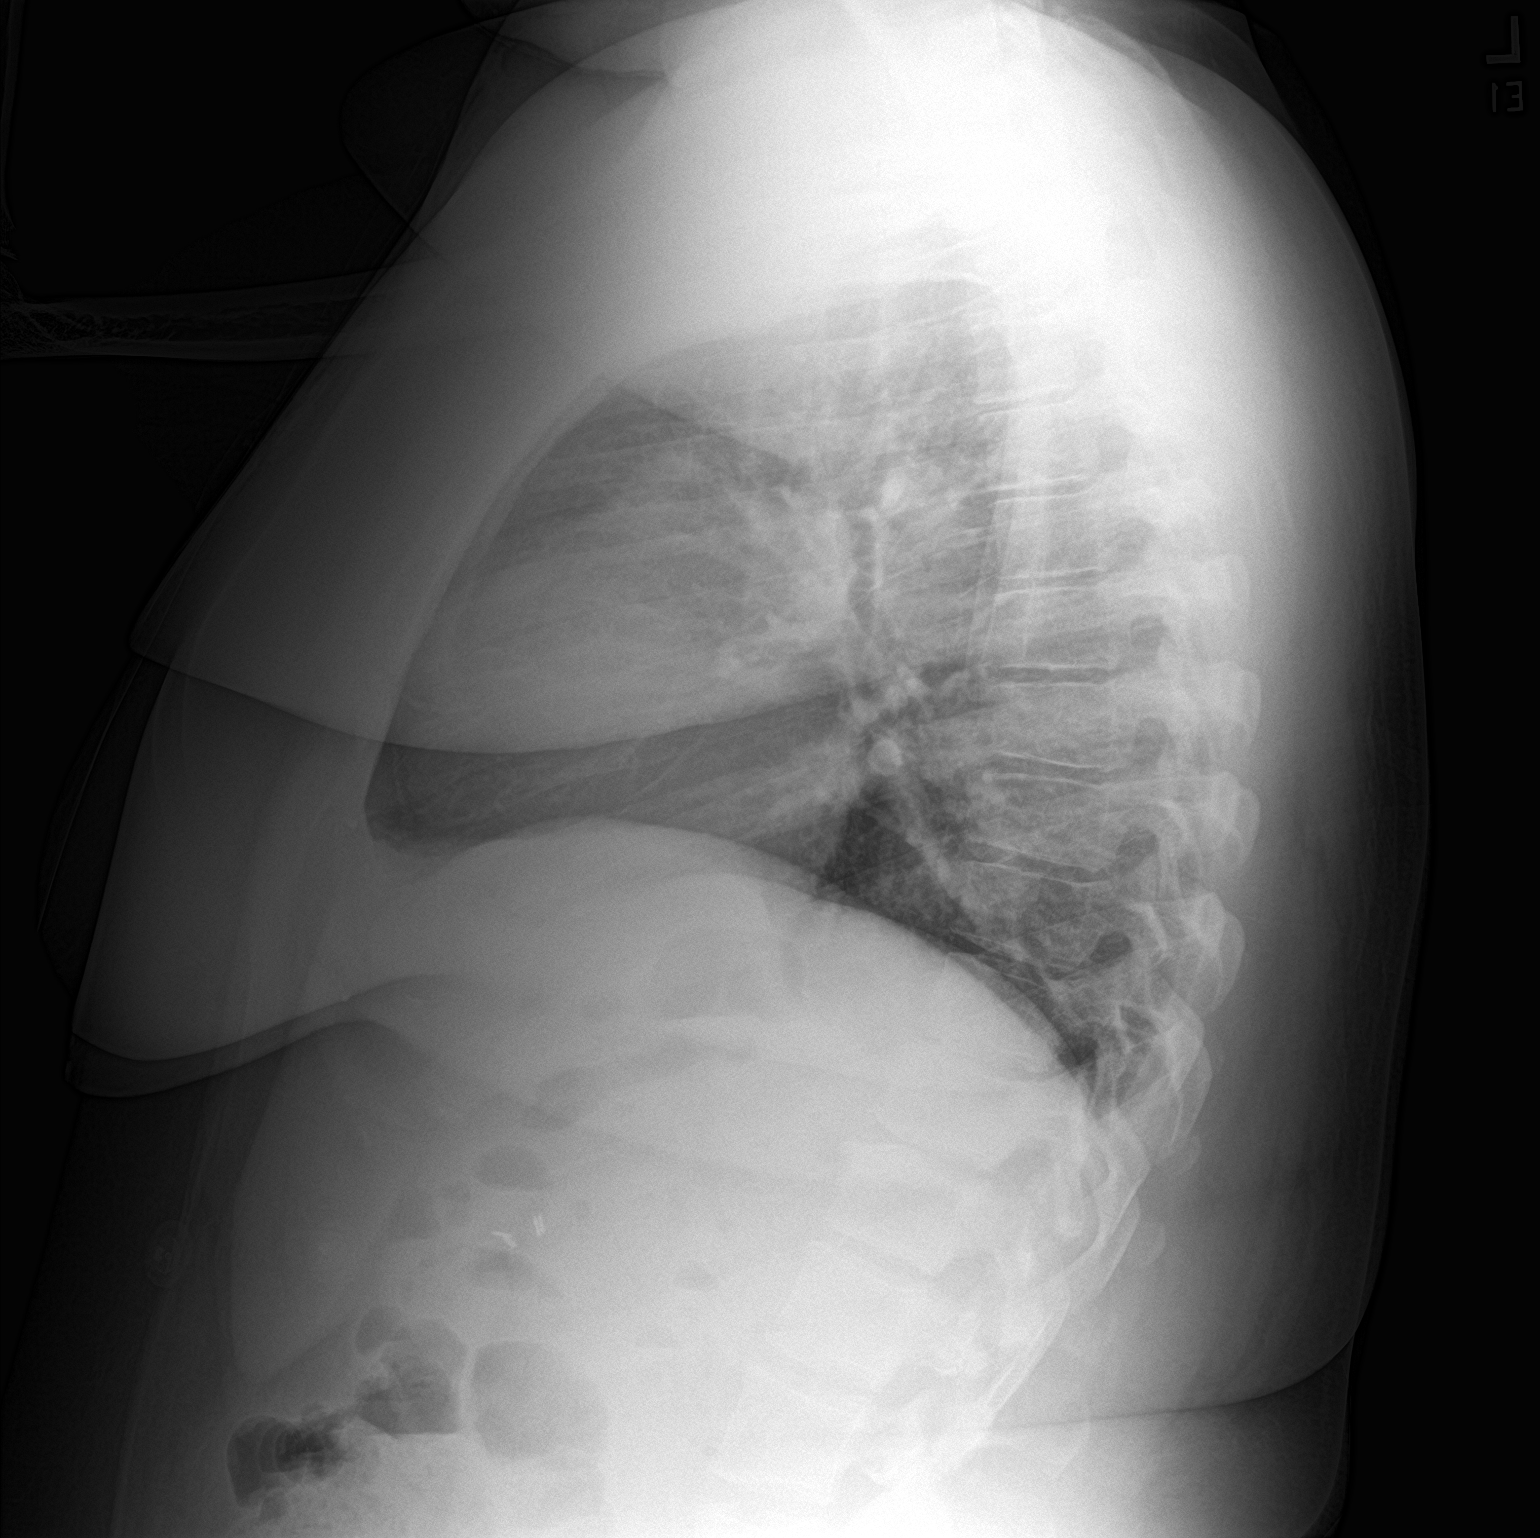

[2 of 2 positions shown; findings below may reference images not displayed]

FINDINGS: Heart and mediastinal contours are within normal limits. No focal
opacities or effusions. No acute bony abnormality.
IMPRESSION: No active cardiopulmonary disease.

## 2021-12-09 ENCOUNTER — Ambulatory Visit: Payer: BC Managed Care – PPO | Admitting: Registered Nurse

## 2021-12-11 ENCOUNTER — Encounter: Payer: Self-pay | Admitting: Registered Nurse

## 2021-12-11 ENCOUNTER — Ambulatory Visit: Payer: BC Managed Care – PPO | Admitting: Registered Nurse

## 2021-12-11 ENCOUNTER — Other Ambulatory Visit: Payer: Self-pay

## 2021-12-11 VITALS — BP 132/82 | HR 85 | Temp 98.1°F | Resp 18 | Ht 66.0 in | Wt 256.2 lb

## 2021-12-11 DIAGNOSIS — R7303 Prediabetes: Secondary | ICD-10-CM | POA: Diagnosis not present

## 2021-12-11 DIAGNOSIS — Z6841 Body Mass Index (BMI) 40.0 and over, adult: Secondary | ICD-10-CM

## 2021-12-11 DIAGNOSIS — Z862 Personal history of diseases of the blood and blood-forming organs and certain disorders involving the immune mechanism: Secondary | ICD-10-CM | POA: Diagnosis not present

## 2021-12-11 DIAGNOSIS — E559 Vitamin D deficiency, unspecified: Secondary | ICD-10-CM | POA: Diagnosis not present

## 2021-12-11 LAB — LIPID PANEL
Cholesterol: 151 mg/dL (ref 0–200)
HDL: 40.6 mg/dL (ref >39.00)
LDL Cholesterol: 89 mg/dL (ref 0–99)
NonHDL: 110.71
Total CHOL/HDL Ratio: 4
Triglycerides: 110 mg/dL (ref 0.0–149.0)
VLDL: 22 mg/dL (ref 0.0–40.0)

## 2021-12-11 LAB — VITAMIN D 25 HYDROXY (VIT D DEFICIENCY, FRACTURES): VITD: 31.79 ng/mL (ref 30.00–100.00)

## 2021-12-11 LAB — HEMOGLOBIN A1C: Hgb A1c MFr Bld: 5.4 % (ref 4.6–6.5)

## 2021-12-11 MED ORDER — SEMAGLUTIDE (2 MG/DOSE) 8 MG/3ML ~~LOC~~ SOPN: 2.0000 mg | PEN_INJECTOR | SUBCUTANEOUS | 1 refills | Status: DC

## 2021-12-11 NOTE — Patient Instructions (Addendum)
Ms. Arntson -  ? ?Great to see you! ?Increase semaglutide to 2mg  weekly ?Call if you need anything, otherwise, see you in 6 mo ? ?Thanks, ? ?Rich  ? ? ? ?If you have lab work done today you will be contacted with your lab results within the next 2 weeks.  If you have not heard from then please contact us. The fastest way to get your results is to register for My Chart. ? ? ?IF you received an x-ray today, you will receive an invoice from [facility]. Please contact [facility] at [phone] with questions or concerns regarding your invoice.  ? ?IF you received labwork today, you will receive an invoice from [facility]. Please contact [facility] at [phone] with questions or concerns regarding your invoice.  ? ?Our billing staff will not be able to assist you with questions regarding bills from these companies. ? ?You will be contacted with the lab results as soon as they are available. The fastest way to get your results is to activate your My Chart account. Instructions are located on the last page of this paperwork. If you have not heard from [phone] regarding the results in 2 weeks, please contact this office. ?  ?  ?

## 2021-12-11 NOTE — Progress Notes (Signed)
? ?Established Patient Office Visit ? ?Subjective:  ?Patient ID: Tina Mayer, female    DOB: [birth year]  Age: 36 y.o. MRN: [id number] ? ?CC:  ?Chief Complaint  ?Patient presents with  ? Follow-up  ?  Patient states she is here for a follow up on medication.  ? ? ?HPI ?Tina Mayer presents for follow up on medication ? ?Vitamin D deficiency ?Has taken rx supplement ?No ongoing symptoms. ? ?Prediabetes ?Last a1c 6.3 ?Taking semaglutide. Doing well. Down 20lbs. No AE.  ?No AE. ? ?Hx of anemia ?Will rehceck today ?No symptoms.  ? ?Outpatient Medications Prior to Visit  ?Medication Sig Dispense Refill  ? cyclobenzaprine (FLEXERIL) 10 MG tablet Take 1 tablet (10 mg total) by mouth 3 (three) times daily as needed for muscle spasms. 30 tablet 0  ? ferrous sulfate 325 (65 FE) MG EC tablet Take 325 mg by mouth 3 (three) times daily with meals.    ? INOSITOL PO Take 8 mg by mouth daily.    ? pyridoxine (B-6) 250 MG tablet Take 250 mg by mouth daily.    ? Vitamin D, Ergocalciferol, (DRISDOL) 1.25 MG (50000 UNIT) CAPS capsule TAKE 1 CAPSULE (50,000 UNITS TOTAL) BY MOUTH EVERY 7 (SEVEN) DAYS 4 capsule 2  ? diclofenac (VOLTAREN) 75 MG EC tablet Take 1 tablet (75 mg total) by mouth 2 (two) times daily. 30 tablet 0  ? gabapentin (NEURONTIN) 300 MG capsule Take 1 capsule (300 mg total) by mouth at bedtime. 30 capsule 3  ? Semaglutide, 1 MG/DOSE, 4 MG/3ML SOPN Inject 1 mg as directed once a week. 9 mL 0  ? Semaglutide,0.25 or 0.5MG /DOS, 2 MG/1.5ML SOPN Inject 0.5 mg into the skin once a week. 2 mL 0  ? ?No facility-administered medications prior to visit.  ? ? ?Review of Systems  ?Constitutional: Negative.   ?HENT: Negative.    ?Eyes: Negative.   ?Respiratory: Negative.    ?Cardiovascular: Negative.   ?Gastrointestinal: Negative.   ?Genitourinary: Negative.   ?Musculoskeletal: Negative.   ?Skin: Negative.   ?Neurological: Negative.   ?Psychiatric/Behavioral: Negative.    ?All other systems reviewed and are  negative. ? ?  ?Objective:  ?  ? ?BP 132/82   Pulse 85   Temp 98.1 ?F (36.7 ?C) (Temporal)   Resp 18   Ht 5\' 6"  (1.676 m)   Wt 256 lb 3.2 oz (116.2 kg)   SpO2 100%   BMI 41.35 kg/m?  ? ?Wt Readings from Last 3 Encounters:  ?12/11/21 256 lb 3.2 oz (116.2 kg)  ?10/25/21 260 lb (117.9 kg)  ?09/10/21 276 lb (125.2 kg)  ? ?Physical Exam ?Vitals and nursing note reviewed.  ?Constitutional:   ?   General: She is not in acute distress. ?   Appearance: Normal appearance. She is normal weight. She is not ill-appearing, toxic-appearing or diaphoretic.  ?Cardiovascular:  ?   Rate and Rhythm: Normal rate and regular rhythm.  ?   Heart sounds: Normal heart sounds. No murmur heard. ?  No friction rub. No gallop.  ?Pulmonary:  ?   Effort: Pulmonary effort is normal. No respiratory distress.  ?   Breath sounds: Normal breath sounds. No stridor. No wheezing, rhonchi or rales.  ?Chest:  ?   Chest wall: No tenderness.  ?Skin: ?   General: Skin is warm and dry.  ?Neurological:  ?   General: No focal deficit present.  ?   Mental Status: She is alert and oriented to person, place, and time. Mental status  is at baseline.  ?Psychiatric:     ?   Mood and Affect: Mood normal.     ?   Behavior: Behavior normal.     ?   Thought Content: Thought content normal.     ?   Judgment: Judgment normal.  ? ? ?No results found for any visits on 12/11/21. ? ? ? ?The ASCVD Risk score (Arnett DK, et al., 2019) failed to calculate for the following reasons: ?  The 2019 ASCVD risk score is only valid for ages 78 to 11 ? ?  ?Assessment & Plan:  ? ?Problem List Items Addressed This Visit   ?None ?Visit Diagnoses   ? ? Prediabetes    -  Primary  ? Relevant Medications  ? Semaglutide, 2 MG/DOSE, 8 MG/3ML SOPN  ? Other Relevant Orders  ? Iron, TIBC and Ferritin Panel  ? Lipid panel  ? Hemoglobin A1c  ? Vitamin D deficiency      ? Relevant Orders  ? Vitamin D (25 hydroxy)  ? Iron, TIBC and Ferritin Panel  ? Lipid panel  ? History of anemia      ? Relevant  Orders  ? Iron, TIBC and Ferritin Panel  ? Lipid panel  ? Class 3 severe obesity due to excess calories without serious comorbidity with body mass index (BMI) of 40.0 to 44.9 in adult [facility])      ? Relevant Medications  ? Semaglutide, 2 MG/DOSE, 8 MG/3ML SOPN  ? ?  ? ? ?Meds ordered this encounter  ?Medications  ? Semaglutide, 2 MG/DOSE, 8 MG/3ML SOPN  ?  Sig: Inject 2 mg as directed once a week.  ?  Dispense:  9 mL  ?  Refill:  1  ?  Order Specific Question:   Supervising Provider  ?  Answer:   Neva Seat, JEFFREY R [[id number]]  ? ? ?No follow-ups on file.  ? ?PLAN ?Labs collected. Will follow up with the patient as warranted. ?Increase semaglutide to 2mg  subq weekly. ?Return in 6 mo ?Patient encouraged to call clinic with any questions, comments, or concerns. ? ? , NP ?

## 2021-12-12 LAB — IRON,TIBC AND FERRITIN PANEL
%SAT: 12 % (calc) — ABNORMAL LOW (ref 16–45)
Ferritin: 11 ng/mL — ABNORMAL LOW (ref 16–154)
Iron: 52 ug/dL (ref 40–190)
TIBC: 443 mcg/dL (calc) (ref 250–450)

## 2021-12-21 ENCOUNTER — Other Ambulatory Visit: Payer: Self-pay | Admitting: Registered Nurse

## 2021-12-21 DIAGNOSIS — I10 Essential (primary) hypertension: Secondary | ICD-10-CM

## 2021-12-31 IMAGING — XA DG SPINAL PUNCT LUMBAR DIAG WITH FL CT GUIDANCE
1 series · 1 of 1 positions shown · non-contrast
Comparison: None

CLINICAL DATA: Polyradiculopathy.  Left arm weakness.  Paresthesia.

EXAM:
DIAGNOSTIC LUMBAR PUNCTURE UNDER FLUOROSCOPIC GUIDANCE

[Series 1: ortho adipose · 1 of 1 slices shown]
[im 1/1]
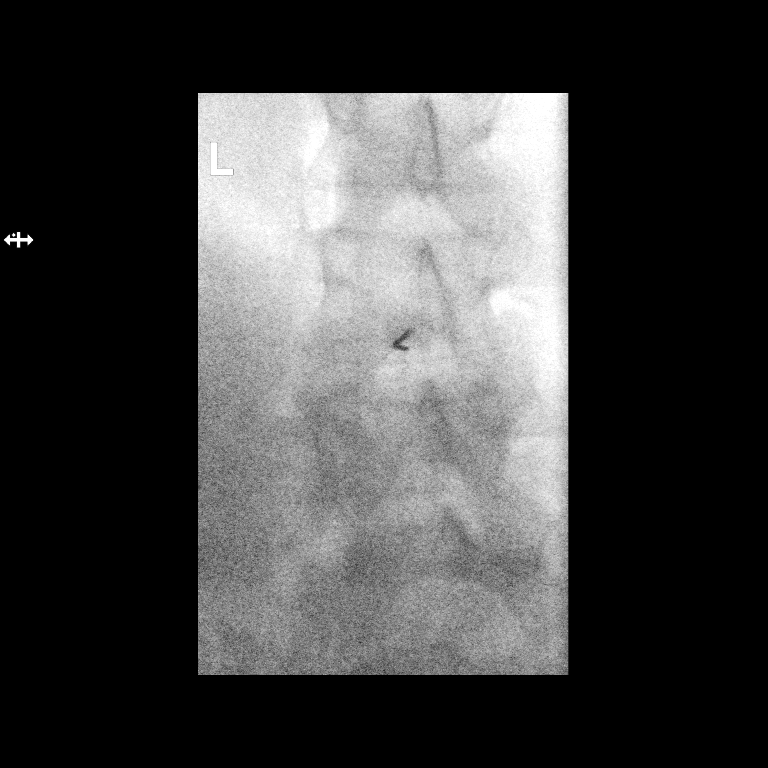

[1 of 1 positions shown; findings below may reference images not displayed]

FLUOROSCOPY TIME:  Fluoroscopy Time:  13 seconds

Radiation Exposure Index (if provided by the fluoroscopic device):
53.36 microGray*m^2

Number of Acquired Spot Images: 0

PROCEDURE:
Informed consent was obtained from the patient prior to the
procedure, including potential complications of headache, allergy,
and pain. With the patient in the left lateral decubitus position,
the lower back was prepped with Betadine. 1% Lidocaine was used for
local anesthesia. Lumbar puncture was performed at the L3-4 level
using a 6 inch 20 gauge needle via a left interlaminar approach with
return of clear CSF with an opening pressure of 24 cm water. 12 mL
of CSF were obtained for laboratory studies. Closing pressure was 18
cm water. The patient tolerated the procedure well and there were no
apparent complications.
IMPRESSION: Technically successful fluoroscopically guided lumbar puncture.
Opening pressure of 24 cm water.

## 2022-01-23 ENCOUNTER — Ambulatory Visit: Payer: BC Managed Care – PPO | Admitting: Registered Nurse

## 2022-01-23 ENCOUNTER — Ambulatory Visit: Payer: BC Managed Care – PPO | Admitting: Family Medicine

## 2022-03-08 ENCOUNTER — Other Ambulatory Visit: Payer: Self-pay | Admitting: Registered Nurse

## 2022-03-08 DIAGNOSIS — R7303 Prediabetes: Secondary | ICD-10-CM

## 2022-03-17 ENCOUNTER — Encounter: Payer: Self-pay | Admitting: Registered Nurse

## 2022-03-20 NOTE — Telephone Encounter (Signed)
I have just received this from the front and will get started on this PA

## 2022-03-27 ENCOUNTER — Telehealth: Payer: Self-pay

## 2022-03-27 NOTE — Telephone Encounter (Signed)
Looks like she's already on semaglutide which is sold under both brand names Wegovy and Ozempic. She can stay on this, we can send 3 mo supply with 1 refill to her pharmacy Thanks  Luan Pulling

## 2022-03-27 NOTE — Telephone Encounter (Signed)
Patient states that you both were seeing if she could get approved for the [facility] before prescribing the medication. I have did the PA for the medication and she did get approved but now need the prescription to [facility].

## 2022-03-27 NOTE — Telephone Encounter (Signed)
The Ozempic has been denied so you told her you would try wegovy she just need the actual prescription is what the patient is stating

## 2022-03-31 ENCOUNTER — Other Ambulatory Visit: Payer: Self-pay | Admitting: Registered Nurse

## 2022-03-31 ENCOUNTER — Telehealth: Payer: Self-pay | Admitting: Registered Nurse

## 2022-03-31 DIAGNOSIS — R7303 Prediabetes: Secondary | ICD-10-CM

## 2022-03-31 MED ORDER — SEMAGLUTIDE-WEIGHT MANAGEMENT 1.7 MG/0.75ML ~~LOC~~ SOAJ: 1.7000 mg | SUBCUTANEOUS | 0 refills | Status: DC

## 2022-03-31 NOTE — Telephone Encounter (Signed)
Will send in 1.7mg  weekly dose which seems to be more available than 2mg  dose. Should not have much limit in effect.  Thanks,  

## 2022-03-31 NOTE — Telephone Encounter (Signed)
Encourage patient to contact the pharmacy for refills or they can request refills through [facility]  (Please schedule appointment if patient has not been seen in over a year)    WHAT PHARMACY WOULD THEY LIKE THIS SENT TO: [facility] [phone]  MEDICATION NAME & DOSE: [id number] refill  NOTES/COMMENTS FROM PATIENT: Did advise pt of backorder of rx       Front office please notify patient: It takes 48-72 hours to process rx refill requests Ask patient to call pharmacy to ensure rx is ready before heading there.

## 2022-05-27 ENCOUNTER — Ambulatory Visit: Payer: BC Managed Care – PPO | Admitting: Registered Nurse

## 2022-06-25 ENCOUNTER — Encounter: Payer: Self-pay | Admitting: Family Medicine

## 2022-06-25 ENCOUNTER — Ambulatory Visit: Payer: BC Managed Care – PPO | Admitting: Family Medicine

## 2022-06-25 DIAGNOSIS — E669 Obesity, unspecified: Secondary | ICD-10-CM | POA: Insufficient documentation

## 2022-06-25 MED ORDER — WEGOVY 2.4 MG/0.75ML ~~LOC~~ SOAJ: 2.4000 mg | SUBCUTANEOUS | 3 refills | Status: DC

## 2022-06-25 NOTE — Assessment & Plan Note (Signed)
New to provider, ongoing for pt.  She is down 14 lbs since last visit.  BMI now 39.  She is tolerating Wegovy w/o difficulty and would like to increase to max dose.  New prescription sent.  Encouraged pt to continue to work on low carb diet and regular exercise

## 2022-06-25 NOTE — Progress Notes (Signed)
   Subjective:    Patient ID: Tina Mayer, female    DOB: 09-Jul-1986, 36 y.o.   MRN: [id number]  HPI Obesity- pt is down 14 lbs since last visit.  BMI now 39.  Currently on Wegovy 1.7mg  weekly.  Pt reports she is taking medication w/o difficulty.  Denies N/V.  Exercising 3x/week.  Has resumed eating chicken ~2x/week.  Eating more of a plant based diet.  Denies CP, SOB, abd pain.     Review of Systems For ROS see HPI     Objective:   Physical Exam Vitals reviewed.  Constitutional:      General: She is not in acute distress.    Appearance: Normal appearance. She is well-developed. She is obese. She is not ill-appearing.  HENT:     Head: Normocephalic and atraumatic.  Eyes:     Conjunctiva/sclera: Conjunctivae normal.     Pupils: Pupils are equal, round, and reactive to light.  Neck:     Thyroid: No thyromegaly.  Cardiovascular:     Rate and Rhythm: Normal rate and regular rhythm.     Pulses: Normal pulses.     Heart sounds: Normal heart sounds. No murmur heard. Pulmonary:     Effort: Pulmonary effort is normal. No respiratory distress.     Breath sounds: Normal breath sounds.  Abdominal:     General: There is no distension.     Palpations: Abdomen is soft.     Tenderness: There is no abdominal tenderness.  Musculoskeletal:     Cervical back: Normal range of motion and neck supple.     Right lower leg: No edema.     Left lower leg: No edema.  Lymphadenopathy:     Cervical: No cervical adenopathy.  Skin:    General: Skin is warm and dry.  Neurological:     General: No focal deficit present.     Mental Status: She is alert and oriented to person, place, and time.  Psychiatric:        Mood and Affect: Mood normal.        Behavior: Behavior normal.           Assessment & Plan:

## 2022-06-25 NOTE — Patient Instructions (Addendum)
Schedule follow up for late January/early February w/ Joanna INCREASE the [facility] to 2.4 weekly Continue to work on healthy diet and regular exercise- you're doing great!!! Call with any questions or concerns Stay Safe!  Stay Healthy! Happy Fall!!

## 2022-07-08 ENCOUNTER — Ambulatory Visit: Payer: BC Managed Care – PPO | Admitting: Neurology

## 2022-08-27 DIAGNOSIS — Z113 Encounter for screening for infections with a predominantly sexual mode of transmission: Secondary | ICD-10-CM | POA: Diagnosis not present

## 2022-08-27 DIAGNOSIS — N390 Urinary tract infection, site not specified: Secondary | ICD-10-CM | POA: Diagnosis not present

## 2022-10-16 ENCOUNTER — Other Ambulatory Visit: Payer: Self-pay | Admitting: Family Medicine

## 2022-10-17 NOTE — Telephone Encounter (Signed)
Patient is requesting a refill of the following medications: Requested Prescriptions   Pending Prescriptions Disp Refills   WEGOVY 2.4 MG/0.75ML SOAJ [Pharmacy Med Name: WEGOVY 2.4 MG/0.75 ML PEN]  3    Sig: Inject 2.4 mg into the skin once a week.    Date of patient request: 10/17/22 Last office visit: 06/25/22 Date of last refill: 06/25/22

## 2022-10-20 ENCOUNTER — Ambulatory Visit: Payer: BC Managed Care – PPO | Admitting: Family Medicine

## 2022-12-12 ENCOUNTER — Other Ambulatory Visit (HOSPITAL_COMMUNITY): Payer: Self-pay

## 2022-12-22 DIAGNOSIS — N939 Abnormal uterine and vaginal bleeding, unspecified: Secondary | ICD-10-CM | POA: Insufficient documentation

## 2022-12-23 ENCOUNTER — Ambulatory Visit (INDEPENDENT_AMBULATORY_CARE_PROVIDER_SITE_OTHER): Payer: BC Managed Care – PPO | Admitting: Family Medicine

## 2022-12-23 ENCOUNTER — Encounter: Payer: Self-pay | Admitting: Family Medicine

## 2022-12-23 DIAGNOSIS — Z1329 Encounter for screening for other suspected endocrine disorder: Secondary | ICD-10-CM

## 2022-12-23 DIAGNOSIS — Z13228 Encounter for screening for other metabolic disorders: Secondary | ICD-10-CM

## 2022-12-23 DIAGNOSIS — E66813 Obesity, class 3: Secondary | ICD-10-CM

## 2022-12-23 DIAGNOSIS — Z130 Encounter for screening for diseases of the blood and blood-forming organs and certain disorders involving the immune mechanism: Secondary | ICD-10-CM | POA: Diagnosis not present

## 2022-12-23 DIAGNOSIS — Z6841 Body Mass Index (BMI) 40.0 and over, adult: Secondary | ICD-10-CM | POA: Diagnosis not present

## 2022-12-23 DIAGNOSIS — Z124 Encounter for screening for malignant neoplasm of cervix: Secondary | ICD-10-CM | POA: Diagnosis not present

## 2022-12-23 DIAGNOSIS — E559 Vitamin D deficiency, unspecified: Secondary | ICD-10-CM | POA: Diagnosis not present

## 2022-12-23 DIAGNOSIS — J4521 Mild intermittent asthma with (acute) exacerbation: Secondary | ICD-10-CM

## 2022-12-23 DIAGNOSIS — Z7689 Persons encountering health services in other specified circumstances: Secondary | ICD-10-CM

## 2022-12-23 DIAGNOSIS — Z862 Personal history of diseases of the blood and blood-forming organs and certain disorders involving the immune mechanism: Secondary | ICD-10-CM

## 2022-12-23 MED ORDER — ALBUTEROL SULFATE HFA 108 (90 BASE) MCG/ACT IN AERS: 2.0000 | INHALATION_SPRAY | Freq: Four times a day (QID) | RESPIRATORY_TRACT | 0 refills | Status: DC | PRN

## 2022-12-23 MED ORDER — WEGOVY 1 MG/0.5ML ~~LOC~~ SOAJ: 1.0000 mg | SUBCUTANEOUS | 0 refills | Status: DC

## 2022-12-23 NOTE — Patient Instructions (Signed)
It was a pleasure to meet you and I look forward to taking care of you.  [facility] 1mg  weekly injection for 4 weeks. Follow up in 1 month for obesity.  -Prescribed Albuterol Inhaler as needed for wheezing and cough.  -Recommend to try Zyrtec for seasonal allergies.  -Ordered labs. Office will call with lab results and you will see your results on [place] a referral to OBGYN.  -Keep up exercising and health diet.

## 2022-12-23 NOTE — Assessment & Plan Note (Signed)
Through shared decision making, will restart Wegovy at 1mg  per weekly injection for 4 weeks. Didn't restart at previous dose since it was the highest dose and concerned that she may have some side effects with restarting maximum dose. Encouraged to continue with exercising and eating a healthy diet. Follow up in 1 month to reassess obesity.

## 2022-12-23 NOTE — Assessment & Plan Note (Signed)
Prescribed Albuterol Inhaler as needed for wheezing and cough. Also, recommend Zyrtec at bedtime to help with allergies that could be affecting asthma symptoms.

## 2022-12-23 NOTE — Progress Notes (Signed)
New Patient Office Visit  Subjective    Patient ID: Tina Mayer, female    DOB: 1985-10-08  Age: 37 y.o. MRN: [id number]  CC:  Chief Complaint  Patient presents with   Establish Care    Pt is here today to [facility]. Pt reports dry cough and wheezing. Pt has hx as child asthma. Pt reports she was on [facility] in past and would like to talk about this today.    HPI Tina Mayer presents to establish care with new provider, obesity, and symptoms of dry cough and wheezing.   Patient has been previously been taking Wegovy for obesity. Patient last took at the end of February of 2.4mg  weekly. Discontinued because she needed a follow up appointment. Last appointment was on June 25, 2022. Denies GI symptoms when she was taking medication.  Diet: Incorporated meats in diet, mostly chicken and fish. Limit process foods. Low carb foods and decreased sugars; primarily drinking water, sometimes smoothies. Exercise: 4 times a week, 90 minutes   Patient reports she has a dry cough and wheezing. This started about 1.5 weeks ago. Noticed a little mucous earlier today, but this was the first time she has seen anything. Denies chest pain, sinus complaints, sore throat, or ear ache. She has a history of asthma as a child.   Outpatient Encounter Medications as of 12/23/2022  Medication Sig   albuterol (VENTOLIN HFA) 108 (90 Base) MCG/ACT inhaler Inhale 2 puffs into the lungs every 6 (six) hours as needed for wheezing or shortness of breath.   ferrous sulfate 325 (65 FE) MG EC tablet Take 325 mg by mouth 3 (three) times daily with meals.   magnesium oxide (MAG-OX) 400 (240 Mg) MG tablet Take 400 mg by mouth daily.   Semaglutide-Weight Management (WEGOVY) 1 MG/0.5ML SOAJ Inject 1 mg into the skin once a week.   Vitamin D, Ergocalciferol, (DRISDOL) 1.25 MG (50000 UNIT) CAPS capsule TAKE 1 CAPSULE (50,000 UNITS TOTAL) BY MOUTH EVERY 7 (SEVEN) DAYS   [DISCONTINUED] amLODipine-benazepril  (LOTREL) 5-10 MG capsule Take 1 tablet by mouth daily. (Patient not taking: Reported on 12/23/2022)   [DISCONTINUED] cyclobenzaprine (FLEXERIL) 10 MG tablet TAKE 1 TABLET BY MOUTH THREE TIMES A DAY AS NEEDED FOR MUSCLE SPASMS (Patient not taking: Reported on 12/23/2022)   [DISCONTINUED] diclofenac (VOLTAREN) 75 MG EC tablet Take 1 tablet by mouth 2 (two) times daily. (Patient not taking: Reported on 12/23/2022)   [DISCONTINUED] fluconazole (DIFLUCAN) 150 MG tablet 1 tablet Orally today, repeat in 72 hours for 3 days (Patient not taking: Reported on 12/23/2022)   [DISCONTINUED] INOSITOL PO Take 8 mg by mouth daily. (Patient not taking: Reported on 12/23/2022)   [DISCONTINUED] phenazopyridine (PYRIDIUM) 200 MG tablet TAKE 1 TABLET BY MOUTH THREE TIMES A DAY FOR 2 DAYS (Patient not taking: Reported on 12/23/2022)   [DISCONTINUED] predniSONE (DELTASONE) 10 MG tablet PLEASE SEE ATTACHED FOR DETAILED DIRECTIONS (Patient not taking: Reported on 12/23/2022)   [DISCONTINUED] Semaglutide,0.25 or 0.5MG /DOS, (OZEMPIC, 0.25 OR 0.5 MG/DOSE,) 2 MG/1.5ML SOPN Inject 0.5 mg into the skin once a week. (Patient not taking: Reported on 12/23/2022)   [DISCONTINUED] Semaglutide-Weight Management (WEGOVY) 2.4 MG/0.75ML SOAJ INJECT 2.4 MG INTO THE SKIN ONCE A WEEK. (Patient not taking: Reported on 12/23/2022)   [DISCONTINUED] sulfamethoxazole-trimethoprim (BACTRIM DS) 800-160 MG tablet TAKE 1 TABLET BY MOUTH EVERY 12 HOURS FOR 5 DAYS (Patient not taking: Reported on 12/23/2022)   [DISCONTINUED] traMADol (ULTRAM) 50 MG tablet Take 1 tablet by mouth every 8 (eight)  hours as needed. (Patient not taking: Reported on 12/23/2022)   No facility-administered encounter medications on file as of 12/23/2022.    Past Medical History:  Diagnosis Date   Anemia    Asthma    as a child   Vitamin D deficiency     Past Surgical History:  Procedure Laterality Date   CESAREAN SECTION     CHOLECYSTECTOMY     TUBAL LIGATION      Family History  Problem  Relation Age of Onset   Hypertension Mother    Testicular cancer Father    Diabetes Maternal Grandmother    Hypertension Maternal Grandmother     Social History   Socioeconomic History   Marital status: Divorced    Spouse name: Not on file   Number of children: 3   Years of education: Not on file   Highest education level: Some college, no degree  Occupational History   Not on file  Tobacco Use   Smoking status: Never   Smokeless tobacco: Never  Vaping Use   Vaping Use: Never used  Substance and Sexual Activity   Alcohol use: Yes    Comment: Once a month   Drug use: Never   Sexual activity: Yes  Other Topics Concern   Not on file  Social History Narrative   Right Handed    Lives in a one story home    Social Determinants of Health   Financial Resource Strain: Medium Risk (12/23/2022)   Overall Financial Resource Strain (CARDIA)    Difficulty of Paying Living Expenses: Somewhat hard  Food Insecurity: No Food Insecurity (12/23/2022)   Hunger Vital Sign    Worried About Running Out of Food in the Last Year: Never true    Ran Out of Food in the Last Year: Never true  Transportation Needs: No Transportation Needs (12/23/2022)   PRAPARE - Hydrologist (Medical): No    Lack of Transportation (Non-Medical): No  Physical Activity: Sufficiently Active (12/23/2022)   Exercise Vital Sign    Days of Exercise per Week: 4 days    Minutes of Exercise per Session: 90 min  Stress: No Stress Concern Present (12/23/2022)   [place]    Feeling of Stress : Only a little  Social Connections: Socially Isolated (12/23/2022)   Social Connection and Isolation Panel [NHANES]    Frequency of Communication with Friends and Family: More than three times a week    Frequency of Social Gatherings with Friends and Family: Three times a week    Attends Religious Services: Never    Active Member of Clubs or  Organizations: No    Attends Archivist Meetings: Never    Marital Status: Divorced  Human resources officer Violence: Not At Risk (12/23/2022)   Humiliation, Afraid, Rape, and Kick questionnaire    Fear of Current or Ex-Partner: No    Emotionally Abused: No    Physically Abused: No    Sexually Abused: No    ROS See HPI above    Objective   BP 122/74   Pulse 75   Temp 98.1 F (36.7 C)   Ht 5\' 6"  (1.676 m)   Wt 248 lb 2 oz (112.5 kg)   SpO2 99%   BMI 40.05 kg/m   Physical Exam Vitals reviewed.  Constitutional:      General: She is not in acute distress.    Appearance: Normal appearance. She is obese. She is  not ill-appearing, toxic-appearing or diaphoretic.  HENT:     Head: Normocephalic and atraumatic.  Eyes:     General:        Right eye: No discharge.        Left eye: No discharge.     Conjunctiva/sclera: Conjunctivae normal.  Cardiovascular:     Rate and Rhythm: Normal rate and regular rhythm.     Heart sounds: Normal heart sounds. No murmur heard.    No friction rub. No gallop.  Pulmonary:     Effort: Pulmonary effort is normal. No respiratory distress.     Breath sounds: Wheezing (Posterior right upper and lower lobes) present.  Musculoskeletal:        General: Normal range of motion.  Skin:    General: Skin is warm and dry.  Neurological:     General: No focal deficit present.     Mental Status: She is alert and oriented to person, place, and time. Mental status is at baseline.  Psychiatric:        Mood and Affect: Mood normal.        Behavior: Behavior normal.        Thought Content: Thought content normal.        Judgment: Judgment normal.      Assessment & Plan:  Class 3 severe obesity without serious comorbidity with body mass index (BMI) of 40.0 to 44.9 in adult, unspecified obesity type Assessment & Plan: Through shared decision making, will restart Wegovy at 1mg  per weekly injection for 4 weeks. Didn't restart at previous dose since it was  the highest dose and concerned that she may have some side effects with restarting maximum dose. Encouraged to continue with exercising and eating a healthy diet. Follow up in 1 month to reassess obesity.   Orders: -     Wegovy; Inject 1 mg into the skin once a week.  Dispense: 2 mL; Refill: 0  Mild intermittent asthma with acute exacerbation Assessment & Plan: Prescribed Albuterol Inhaler as needed for wheezing and cough. Also, recommend Zyrtec at bedtime to help with allergies that could be affecting asthma symptoms.   Orders: -     Albuterol Sulfate HFA; Inhale 2 puffs into the lungs every 6 (six) hours as needed for wheezing or shortness of breath.  Dispense: 8 g; Refill: 0  Cervical cancer screening -     Ambulatory referral to Obstetrics / Gynecology  Screening for endocrine, metabolic and immunity disorder -     Comprehensive metabolic panel -     TSH  Vitamin D deficiency -     VITAMIN D 25 Hydroxy (Vit-D Deficiency, Fractures)  History of anemia -     CBC with Differential/Platelet -     Iron, TIBC and Ferritin Panel  Establishing care with new doctor, encounter for  1.Review health maintenance: -Decline Tdap, aware that if she has an injury, she will get vaccine. -Placed a referral to GYN for pap smear. 2.Ordered screening labs: CMP and TSH; Ordered vitamin D for previous deficiency with patient taking supplement. Ordered CBC and Iron panel with history of anemia and taking iron supplement some days.  Return in about 1 month (around 01/22/2023) for follow-up: weight loss .   Valarie Merino, NP

## 2022-12-24 ENCOUNTER — Telehealth: Payer: Self-pay

## 2022-12-24 ENCOUNTER — Other Ambulatory Visit (HOSPITAL_COMMUNITY): Payer: Self-pay

## 2022-12-24 ENCOUNTER — Other Ambulatory Visit: Payer: Self-pay

## 2022-12-24 DIAGNOSIS — E569 Vitamin deficiency, unspecified: Secondary | ICD-10-CM

## 2022-12-24 DIAGNOSIS — E611 Iron deficiency: Secondary | ICD-10-CM

## 2022-12-24 LAB — COMPREHENSIVE METABOLIC PANEL
ALT: 12 U/L (ref 0–35)
AST: 15 U/L (ref 0–37)
Albumin: 4.2 g/dL (ref 3.5–5.2)
Alkaline Phosphatase: 73 U/L (ref 39–117)
BUN: 9 mg/dL (ref 6–23)
CO2: 26 mEq/L (ref 19–32)
Calcium: 9.3 mg/dL (ref 8.4–10.5)
Chloride: 105 mEq/L (ref 96–112)
Creatinine, Ser: 0.82 mg/dL (ref 0.40–1.20)
GFR: 90.52 mL/min (ref >60.00)
Glucose, Bld: 101 mg/dL — ABNORMAL HIGH (ref 70–99)
Potassium: 4.1 mEq/L (ref 3.5–5.1)
Sodium: 137 mEq/L (ref 135–145)
Total Bilirubin: 0.5 mg/dL (ref 0.2–1.2)
Total Protein: 8 g/dL (ref 6.0–8.3)

## 2022-12-24 LAB — IRON,TIBC AND FERRITIN PANEL
%SAT: 7 % (calc) — ABNORMAL LOW (ref 16–45)
Ferritin: 6 ng/mL — ABNORMAL LOW (ref 16–154)
Iron: 34 ug/dL — ABNORMAL LOW (ref 40–190)
TIBC: 471 mcg/dL (calc) — ABNORMAL HIGH (ref 250–450)

## 2022-12-24 LAB — CBC WITH DIFFERENTIAL/PLATELET
Basophils Absolute: 0 10*3/uL (ref 0.0–0.1)
Basophils Relative: 0.8 % (ref 0.0–3.0)
Eosinophils Absolute: 0.1 10*3/uL (ref 0.0–0.7)
Eosinophils Relative: 2.5 % (ref 0.0–5.0)
HCT: 34 % — ABNORMAL LOW (ref 36.0–46.0)
Hemoglobin: 11.4 g/dL — ABNORMAL LOW (ref 12.0–15.0)
Lymphocytes Relative: 39.8 % (ref 12.0–46.0)
Lymphs Abs: 1.7 10*3/uL (ref 0.7–4.0)
MCHC: 33.4 g/dL (ref 30.0–36.0)
MCV: 82.7 fl (ref 78.0–100.0)
Monocytes Absolute: 0.5 10*3/uL (ref 0.1–1.0)
Monocytes Relative: 11.7 % (ref 3.0–12.0)
Neutro Abs: 1.9 10*3/uL (ref 1.4–7.7)
Neutrophils Relative %: 45.2 % (ref 43.0–77.0)
Platelets: 310 10*3/uL (ref 150.0–400.0)
RBC: 4.12 Mil/uL (ref 3.87–5.11)
RDW: 13.1 % (ref 11.5–15.5)
WBC: 4.3 10*3/uL (ref 4.0–10.5)

## 2022-12-24 LAB — TSH: TSH: 1.68 u[IU]/mL (ref 0.35–5.50)

## 2022-12-24 LAB — VITAMIN D 25 HYDROXY (VIT D DEFICIENCY, FRACTURES): VITD: 19.2 ng/mL — ABNORMAL LOW (ref 30.00–100.00)

## 2022-12-24 MED ORDER — VITAMIN D (ERGOCALCIFEROL) 1.25 MG (50000 UNIT) PO CAPS: 50000.0000 [IU] | ORAL_CAPSULE | ORAL | 0 refills | Status: DC

## 2022-12-24 NOTE — Telephone Encounter (Signed)
Pharmacy Patient Advocate Encounter   Received notification from [employer] that prior authorization for Wegovy 1MG /0.5ML auto-injectors is required/requested.  Per Test Claim: PA Required   PA submitted on 12/24/22 to (ins) [employer] via CoverMyMeds Key or (Medicaid) confirmation # [id number] Status is pending

## 2022-12-24 NOTE — Telephone Encounter (Signed)
-----   Message from Evangeline Gula, NP sent at 12/24/2022 12:10 PM EDT ----- Vitamin D is significantly low- recommend Vitamin D3 50,000IU tablet, 1 tablet once a week for 12 weeks. Recheck vitamin D in 3 months, will need lab visit.  HGB and HCT are low and has a low iron/iron storage. Already placed a referral to hematology.  All other labs are stable.

## 2022-12-24 NOTE — Telephone Encounter (Signed)
-----   Message from Evangeline Gula, NP sent at 12/24/2022  7:53 AM EDT ----- Iron and iron storage is low, recommend to take iron supplement everyday. Recommend a hematology referral.

## 2022-12-25 NOTE — Telephone Encounter (Signed)
Patient Advocate Encounter  Prior Authorization for [employer] 1MG /0.5ML auto-injectors has been approved.    Effective dates: 12/24/22 through 07/26/23

## 2022-12-26 ENCOUNTER — Other Ambulatory Visit (HOSPITAL_COMMUNITY): Payer: Self-pay

## 2022-12-31 ENCOUNTER — Telehealth: Payer: Self-pay | Admitting: Oncology

## 2022-12-31 NOTE — Telephone Encounter (Signed)
Attempted to contact patient in regards to scheduling referral, no answer so voicemail was left for patient to call back

## 2023-01-22 ENCOUNTER — Other Ambulatory Visit: Payer: Self-pay | Admitting: *Deleted

## 2023-01-22 DIAGNOSIS — E611 Iron deficiency: Secondary | ICD-10-CM

## 2023-01-28 ENCOUNTER — Ambulatory Visit (INDEPENDENT_AMBULATORY_CARE_PROVIDER_SITE_OTHER): Payer: BC Managed Care – PPO | Admitting: Family Medicine

## 2023-01-28 VITALS — BP 108/78 | HR 86 | Temp 98.8°F | Ht 66.0 in | Wt 253.1 lb

## 2023-01-28 DIAGNOSIS — Z6841 Body Mass Index (BMI) 40.0 and over, adult: Secondary | ICD-10-CM

## 2023-01-28 MED ORDER — WEGOVY 1.7 MG/0.75ML ~~LOC~~ SOAJ: 1.7000 mg | SUBCUTANEOUS | 0 refills | Status: AC

## 2023-01-28 NOTE — Progress Notes (Unsigned)
Established Patient Office Visit   Subjective:  Patient ID: Tina Mayer, female    DOB: [birth year]  Age: 37 y.o. MRN: [id number]  Chief Complaint  Patient presents with   Weight Loss    Pt is here today to F/U on Wegovy. Pt reports she is taking this and has no concerns.    HPI Obesity: On previous visit, restarted Wegovy 1mg  per week injections for weight loss. She had previously been on [facility] 2.4mg  for obesity with no complications. It was discontinued to not having a following up appointment last time. She reports she has been doing good with the medication. No GI symptoms. She is still exercising 4 times a week, 90 minutes at a time. Still working on a healthy diet of decreasing carbohydrate and sugars.   Wt Readings from Last 3 Encounters:  01/28/23 253 lb 2 oz (114.8 kg)  12/23/22 248 lb 2 oz (112.5 kg)  06/25/22 242 lb (109.8 kg)    ROS See HPI above    Objective:   BP 108/78   Pulse 86   Temp 98.8 F (37.1 C)   Ht 5\' 6"  (1.676 m)   Wt 253 lb 2 oz (114.8 kg)   SpO2 97%   BMI 40.86 kg/m  {Vitals History (Optional):23777}  Physical Exam Vitals reviewed.  Constitutional:      General: She is not in acute distress.    Appearance: Normal appearance. She is obese. She is not ill-appearing, toxic-appearing or diaphoretic.  HENT:     Head: Normocephalic and atraumatic.  Eyes:     General:        Right eye: No discharge.        Left eye: No discharge.     Conjunctiva/sclera: Conjunctivae normal.  Cardiovascular:     Rate and Rhythm: Normal rate and regular rhythm.     Heart sounds: Normal heart sounds. No murmur heard.    No friction rub. No gallop.  Pulmonary:     Effort: Pulmonary effort is normal. No respiratory distress.     Breath sounds: Normal breath sounds.  Musculoskeletal:        General: Normal range of motion.  Skin:    General: Skin is warm and dry.  Neurological:     General: No focal deficit present.     Mental Status: She is  alert and oriented to person, place, and time. Mental status is at baseline.  Psychiatric:        Mood and Affect: Mood normal.        Behavior: Behavior normal.        Thought Content: Thought content normal.        Judgment: Judgment normal.      Assessment & Plan:  Class 3 severe obesity due to excess calories without serious comorbidity with body mass index (BMI) of 40.0 to 44.9 in adult [facility]) Assessment & Plan: Had a 5Ib gain. Discontinue Wegovy 1mg  per injection and prescribed Wegovy 1.7mg  per injection for 4 weeks. Advised when she got to her last injection, call back to the office to speak [place], CMA. If she is doing good with 1.7mg , will increase to 2.4mg  per injection. Continue with diet and exercise.   Orders: -     Wegovy; Inject 1.7 mg into the skin once a week for 28 days.  Dispense: 3 mL; Refill: 0  -Advised patient to call for a GYN appointment from the referral at last visit.  [facility] for [facility]  at [facility] for an GYN appointment.  [address] [place],  [place]  [address] [phone]    Return in about 3 months (around 04/30/2023) for follow-up.   Zandra Abts, NP

## 2023-01-28 NOTE — Assessment & Plan Note (Signed)
Had a 5Ib gain. Discontinue Wegovy 1mg  per injection and prescribed Wegovy 1.7mg  per injection for 4 weeks. Advised when she got to her last injection, call back to the office to speak [place], CMA. If she is doing good with 1.7mg , will increase to 2.4mg  per injection. Continue with diet and exercise.

## 2023-01-28 NOTE — Patient Instructions (Addendum)
-  Increased Wegovy to 1.7mg  weekly for 4 weeks. When you get to your last injection, call back to the office, speak with Archie Patten, CMA. If you are doing well, we can increase to 2.4mg  per injection. -Call [facility] for [facility] at [facility] for an GYN appointment.  [address] [place],  [place]  [address] [phone]

## 2023-01-29 ENCOUNTER — Inpatient Hospital Stay: Payer: BC Managed Care – PPO | Admitting: Nurse Practitioner

## 2023-01-29 ENCOUNTER — Inpatient Hospital Stay: Payer: BC Managed Care – PPO

## 2023-02-24 ENCOUNTER — Telehealth: Payer: Self-pay

## 2023-02-24 NOTE — Telephone Encounter (Signed)
Pt wanted to give [place] an update . States he is doing well on Wegovy no side effects to report

## 2023-02-24 NOTE — Telephone Encounter (Signed)
Pt called to report she is doing well on new medication no side effects wegovy doing well.

## 2023-02-25 ENCOUNTER — Encounter: Payer: Self-pay | Admitting: Family Medicine

## 2023-02-26 ENCOUNTER — Encounter: Payer: BC Managed Care – PPO | Admitting: Nurse Practitioner

## 2023-02-26 ENCOUNTER — Encounter: Payer: Self-pay | Admitting: *Deleted

## 2023-02-26 ENCOUNTER — Inpatient Hospital Stay: Payer: BC Managed Care – PPO

## 2023-02-27 ENCOUNTER — Other Ambulatory Visit: Payer: Self-pay

## 2023-02-27 DIAGNOSIS — F419 Anxiety disorder, unspecified: Secondary | ICD-10-CM

## 2023-03-02 ENCOUNTER — Inpatient Hospital Stay: Payer: BC Managed Care – PPO | Attending: Nurse Practitioner

## 2023-03-02 ENCOUNTER — Encounter: Payer: Self-pay | Admitting: Nurse Practitioner

## 2023-03-02 ENCOUNTER — Inpatient Hospital Stay: Payer: BC Managed Care – PPO | Admitting: Nurse Practitioner

## 2023-03-02 VITALS — BP 132/72 | HR 78 | Temp 98.1°F | Resp 18 | Ht 66.0 in | Wt 248.4 lb

## 2023-03-02 DIAGNOSIS — E559 Vitamin D deficiency, unspecified: Secondary | ICD-10-CM | POA: Insufficient documentation

## 2023-03-02 DIAGNOSIS — Z8041 Family history of malignant neoplasm of ovary: Secondary | ICD-10-CM

## 2023-03-02 DIAGNOSIS — E611 Iron deficiency: Secondary | ICD-10-CM

## 2023-03-02 DIAGNOSIS — N920 Excessive and frequent menstruation with regular cycle: Secondary | ICD-10-CM | POA: Diagnosis not present

## 2023-03-02 LAB — CBC WITH DIFFERENTIAL (CANCER CENTER ONLY)
Abs Immature Granulocytes: 0.01 10*3/uL (ref 0.00–0.07)
Basophils Absolute: 0 10*3/uL (ref 0.0–0.1)
Basophils Relative: 1 %
Eosinophils Absolute: 0.1 10*3/uL (ref 0.0–0.5)
Eosinophils Relative: 2 %
HCT: 38.3 % (ref 36.0–46.0)
Hemoglobin: 12.5 g/dL (ref 12.0–15.0)
Immature Granulocytes: 0 %
Lymphocytes Relative: 40 %
Lymphs Abs: 2.4 10*3/uL (ref 0.7–4.0)
MCH: 27.4 pg (ref 26.0–34.0)
MCHC: 32.6 g/dL (ref 30.0–36.0)
MCV: 83.8 fL (ref 80.0–100.0)
Monocytes Absolute: 0.5 10*3/uL (ref 0.1–1.0)
Monocytes Relative: 8 %
Neutro Abs: 3 10*3/uL (ref 1.7–7.7)
Neutrophils Relative %: 49 %
Platelet Count: 261 10*3/uL (ref 150–400)
RBC: 4.57 MIL/uL (ref 3.87–5.11)
RDW: 13.2 % (ref 11.5–15.5)
WBC Count: 6 10*3/uL (ref 4.0–10.5)
nRBC: 0 % (ref 0.0–0.2)

## 2023-03-02 LAB — FERRITIN: Ferritin: 7 ng/mL — ABNORMAL LOW (ref 11–307)

## 2023-03-02 LAB — SAVE SMEAR(SSMR), FOR PROVIDER SLIDE REVIEW

## 2023-03-02 LAB — IRON AND TIBC
Iron: 52 ug/dL (ref 28–170)
Saturation Ratios: 10 % — ABNORMAL LOW (ref 10.4–31.8)
TIBC: 538 ug/dL — ABNORMAL HIGH (ref 250–450)
UIBC: 486 ug/dL

## 2023-03-02 NOTE — Progress Notes (Signed)
New Hematology/Oncology Consult   Requesting MD: Zandra Abts, NP  [phone]      Reason for Consult: Low iron  HPI: Tina Mayer is a 37 year old woman referred for evaluation of a low iron level.  Labs were completed 12/23/2022-hemoglobin 11.4, MCV 82, white count 4.3, platelet count 310,000; iron 34, TIC before 71, percent saturation 7, ferritin 6.  Comparison iron panel from 12/11/2021-iron 52, TIBC 443, percent saturation 12, ferritin 11; comparison CBC 06/12/2021 hemoglobin 12.1, white count 9.6, platelet count 255,000.  She reports being aware of iron deficiency for the past 2 years.  She takes ferrous sulfate 325 mg daily.  No constipation or nausea.  She reports an extremely heavy menstrual cycle for the past 10 years, since a tubal ligation procedure.  She has bleeding for 7 to 8 days, heavy 5 of those days.  She passes blood clots.  She has been referred to gynecology.  She has mild ice craving.  She eats a regular diet.  No bleeding except related to her menstrual cycle.  Energy level is poor.  No shortness of breath.  No tongue soreness.  She notes nails are brittle.   Past Medical History:  Diagnosis Date   Anemia    Asthma    as a child   Vitamin D deficiency      Past Surgical History:  Procedure Laterality Date   CESAREAN SECTION     CHOLECYSTECTOMY     TUBAL LIGATION       Current Outpatient Medications:    ferrous sulfate 325 (65 FE) MG EC tablet, Take 325 mg by mouth 3 (three) times daily with meals., Disp: , Rfl:    magnesium oxide (MAG-OX) 400 (240 Mg) MG tablet, Take 400 mg by mouth daily., Disp: , Rfl:    Vitamin D, Ergocalciferol, (DRISDOL) 1.25 MG (50000 UNIT) CAPS capsule, Take 1 capsule (50,000 Units total) by mouth once a week., Disp: 12 capsule, Rfl: 0   WEGOVY 2.4 MG/0.75ML SOAJ, SMARTSIG:2.4 Milligram(s) SUB-Q Once a Week, Disp: , Rfl:    albuterol (VENTOLIN HFA) 108 (90 Base) MCG/ACT inhaler, Inhale 2 puffs into the lungs every 6 (six) hours  as needed for wheezing or shortness of breath. (Patient not taking: Reported on 03/02/2023), Disp: 8 g, Rfl: 0:     Allergies  Allergen Reactions   Clindamycin Hcl     Other Reaction(s): light headed, N & V, diarrhea   Penicillins Rash and Other (See Comments)    Has patient had a PCN reaction causing immediate rash, facial/tongue/throat swelling, SOB or lightheadedness with hypotension: No  Has patient had a PCN reaction causing severe rash involving mucus membranes or skin necrosis: No  Has patient had a PCN reaction that required hospitalization: No  Has patient had a PCN reaction occurring within the last 10 years: No  If all of the above answers are "NO", then may proceed with Cephalosporin use.    FH: Mother diagnosed with ovarian cancer in her 44s, maternal grandmother deceased history of ovarian cancer, maternal aunt diagnosed with ovarian cancer in her 30s.  No family history of breast cancer.  Father has history of prostate cancer.  SOCIAL HISTORY: She lives in [place].  She has 3 sons ages 83, 53, 68.  She works as a Emergency planning/management officer.  No tobacco use.  Rare EtOH intake.  Review of Systems: No bleeding except related to the monthly menstrual cycle.  Specifically no bloody or black bowel movements.  No hematuria.  No fevers or sweats.  Appetite varies.  No dysphagia.  No change in bowel habits.  No numbness or tingling in the hands or feet.  Physical Exam:  Blood pressure 132/72, pulse 78, temperature 98.1 F (36.7 C), temperature source Oral, resp. rate 18, height 5\' 6"  (1.676 m), weight 248 lb 6.4 oz (112.7 kg), SpO2 100 %.  Lungs: Lungs clear bilaterally. Cardiac: Regular rate and rhythm. Abdomen: No hepatosplenomegaly. Vascular: No leg edema. Lymph nodes: No palpable cervical, supraclavicular, axillary or inguinal lymph nodes. Neurologic: Alert and oriented. Skin: No rash.  LABS:   Recent Labs    03/02/23 1403  WBC 6.0  HGB 12.5  HCT 38.3   PLT 261  Peripheral blood smear-Red cells appear microcytic, no ovalocytes or cigar cells; white blood cells appear unremarkable; platelets appear normal in number.  No results for input(s): "NA", "K", "CL", "CO2", "GLUCOSE", "BUN", "CREATININE", "CALCIUM" in the last 72 hours.    RADIOLOGY:  No results found.  Assessment and Plan:   Iron deficiency  Menorrhagia Multiple family members with ovarian cancer Vitamin D deficiency Asthma  Ms. Corales appears to have early iron deficiency.  She will increase oral iron to twice daily.  She understands to contact the office if she develops constipation.  The likely source of blood loss is her menstrual cycle.  She will submit a urine sample and complete stool cards.  She has been referred to gynecology.  Family history is significant for multiple family members having ovarian cancer including her mother, maternal aunt and maternal grandmother.  Referral made for genetic counseling.  She will return for lab and follow-up in approximately 3 months.  We are available to see her sooner if needed.  Patient seen with Dr. Truett Perna.    Lonna Cobb, NP 03/02/2023, 3:21 PM  This was a shared visit with Lonna Cobb.  Ms. Tropeano was interviewed and examined.  I reviewed the peripheral blood smear.  She is referred for evaluation of iron deficiency.  She has a history of mild anemia, with a hemoglobin is normal today.  The iron deficiency and history of mild anemia are very likely related to menstrual blood loss.  We will obtain a urinalysis and stool Hemoccults and then defer further evaluation for source of blood loss to her primary provider.  The ferritin remains low while on once daily iron.  She will increase the oral iron dose and return for a follow-up CBC in 3 months.  We do not recommend IV iron therapy unless she develops anemia.  She plans to follow-up with gynecology to discuss options for decreasing menstrual blood loss.  We will  refer her to the genetics counselor due to the history of multiple family members with ovarian cancer.  I was present for greater than 50% of today's visit.  I performed medical stage making.  Mancel Bale, MD

## 2023-03-03 ENCOUNTER — Telehealth: Payer: Self-pay | Admitting: Nurse Practitioner

## 2023-03-06 ENCOUNTER — Telehealth: Payer: Self-pay | Admitting: Family Medicine

## 2023-03-06 NOTE — Telephone Encounter (Signed)
Patient has currently denied [employer], patient said they would follow up in the future if they wanted to proceed with the process.

## 2023-03-09 ENCOUNTER — Inpatient Hospital Stay: Payer: BC Managed Care – PPO

## 2023-03-11 DIAGNOSIS — F332 Major depressive disorder, recurrent severe without psychotic features: Secondary | ICD-10-CM | POA: Diagnosis not present

## 2023-03-11 DIAGNOSIS — Z1339 Encounter for screening examination for other mental health and behavioral disorders: Secondary | ICD-10-CM | POA: Diagnosis not present

## 2023-03-11 DIAGNOSIS — F411 Generalized anxiety disorder: Secondary | ICD-10-CM | POA: Diagnosis not present

## 2023-03-13 DIAGNOSIS — F332 Major depressive disorder, recurrent severe without psychotic features: Secondary | ICD-10-CM | POA: Diagnosis not present

## 2023-03-13 DIAGNOSIS — F411 Generalized anxiety disorder: Secondary | ICD-10-CM | POA: Diagnosis not present

## 2023-06-02 ENCOUNTER — Inpatient Hospital Stay: Payer: BC Managed Care – PPO | Attending: Nurse Practitioner

## 2023-06-02 ENCOUNTER — Encounter: Payer: Self-pay | Admitting: Nurse Practitioner

## 2023-06-02 ENCOUNTER — Inpatient Hospital Stay (HOSPITAL_BASED_OUTPATIENT_CLINIC_OR_DEPARTMENT_OTHER): Payer: BC Managed Care – PPO | Admitting: Nurse Practitioner

## 2023-06-02 VITALS — BP 116/80 | HR 70 | Temp 98.1°F | Resp 18 | Ht 66.0 in | Wt 241.3 lb

## 2023-06-02 DIAGNOSIS — Z8041 Family history of malignant neoplasm of ovary: Secondary | ICD-10-CM | POA: Diagnosis not present

## 2023-06-02 DIAGNOSIS — D509 Iron deficiency anemia, unspecified: Secondary | ICD-10-CM | POA: Diagnosis not present

## 2023-06-02 DIAGNOSIS — N920 Excessive and frequent menstruation with regular cycle: Secondary | ICD-10-CM | POA: Diagnosis not present

## 2023-06-02 DIAGNOSIS — E559 Vitamin D deficiency, unspecified: Secondary | ICD-10-CM | POA: Insufficient documentation

## 2023-06-02 DIAGNOSIS — E611 Iron deficiency: Secondary | ICD-10-CM | POA: Diagnosis not present

## 2023-06-02 DIAGNOSIS — J45909 Unspecified asthma, uncomplicated: Secondary | ICD-10-CM | POA: Diagnosis not present

## 2023-06-02 LAB — URINALYSIS, COMPLETE (UACMP) WITH MICROSCOPIC
Bacteria, UA: NONE SEEN
Bilirubin Urine: NEGATIVE
Glucose, UA: NEGATIVE mg/dL
Hgb urine dipstick: NEGATIVE
Ketones, ur: NEGATIVE mg/dL
Leukocytes,Ua: NEGATIVE
Nitrite: NEGATIVE
Protein, ur: NEGATIVE mg/dL
Specific Gravity, Urine: 1.022 (ref 1.005–1.030)
pH: 6.5 (ref 5.0–8.0)

## 2023-06-02 LAB — CBC WITH DIFFERENTIAL (CANCER CENTER ONLY)
Abs Immature Granulocytes: 0.01 10*3/uL (ref 0.00–0.07)
Basophils Absolute: 0 10*3/uL (ref 0.0–0.1)
Basophils Relative: 1 %
Eosinophils Absolute: 0.1 10*3/uL (ref 0.0–0.5)
Eosinophils Relative: 2 %
HCT: 36.9 % (ref 36.0–46.0)
Hemoglobin: 11.9 g/dL — ABNORMAL LOW (ref 12.0–15.0)
Immature Granulocytes: 0 %
Lymphocytes Relative: 31 %
Lymphs Abs: 1.8 10*3/uL (ref 0.7–4.0)
MCH: 28.3 pg (ref 26.0–34.0)
MCHC: 32.2 g/dL (ref 30.0–36.0)
MCV: 87.6 fL (ref 80.0–100.0)
Monocytes Absolute: 0.4 10*3/uL (ref 0.1–1.0)
Monocytes Relative: 7 %
Neutro Abs: 3.4 10*3/uL (ref 1.7–7.7)
Neutrophils Relative %: 59 %
Platelet Count: 259 10*3/uL (ref 150–400)
RBC: 4.21 MIL/uL (ref 3.87–5.11)
RDW: 12.6 % (ref 11.5–15.5)
WBC Count: 5.7 10*3/uL (ref 4.0–10.5)
nRBC: 0 % (ref 0.0–0.2)

## 2023-06-02 LAB — FERRITIN: Ferritin: 10 ng/mL — ABNORMAL LOW (ref 11–307)

## 2023-06-02 NOTE — Progress Notes (Signed)
  [facility] OFFICE PROGRESS NOTE   Diagnosis:  Iron deficiency  INTERVAL HISTORY:   Ms. Tina Mayer returns as scheduled. She continues oral iron, now taking twice daily.  No constipation or nausea.  Menstrual cycle continues to be heavy and lasts an average of 7 days.  No other bleeding.  Main complaint is a poor energy level.  She does not crave ice.  Nails are brittle.  Objective:  Vital signs in last 24 hours:  Blood pressure 116/80, pulse 70, temperature 98.1 F (36.7 C), temperature source Oral, resp. rate 18, height 5\' 6"  (1.676 m), weight 241 lb 4.8 oz (109.5 kg), SpO2 100%.    Resp: Lungs clear bilaterally. Cardio: Regular rate and rhythm. GI: No hepatosplenomegaly. Vascular: No leg edema.   Lab Results:  Lab Results  Component Value Date   WBC 5.7 06/02/2023   HGB 11.9 (L) 06/02/2023   HCT 36.9 06/02/2023   MCV 87.6 06/02/2023   PLT 259 06/02/2023   NEUTROABS 3.4 06/02/2023    Imaging:  No results found.  Medications: I have reviewed the patient's current medications.  Assessment/Plan: Iron deficiency  Menorrhagia Multiple family members with ovarian cancer-referred to genetics Vitamin D deficiency Asthma  Disposition: Ms. Tina Mayer has iron deficiency most likely due to menorrhagia.  Today she is mildly anemic.  She will continue oral iron twice daily.  We will follow-up on the ferritin.  She has an upcoming appointment with gynecology to evaluate the menorrhagia.  She will submit a urine sample today and complete stool cards.  She will return for lab and follow-up in 3 months.   Lonna Cobb ANP/GNP-BC   06/02/2023  2:12 PM

## 2023-06-13 ENCOUNTER — Other Ambulatory Visit: Payer: Self-pay | Admitting: Family Medicine

## 2023-06-15 NOTE — Telephone Encounter (Signed)
Patient is requesting a refill of the following medications: Requested Prescriptions   Pending Prescriptions Disp Refills   WEGOVY 2.4 MG/0.75ML SOAJ [Pharmacy Med Name: WEGOVY 2.4 MG/0.75 ML PEN]  3    Sig: INJECT 2.4 MG INTO THE SKIN ONCE A WEEK.    Date of patient request: 06/15/23 Last office visit: 01/28/23 Date of last refill: 02/25/23 Last refill amount:  Follow up time period per chart: 3 months  this shows it is from a historical provider please advise if they need to make an appointment to have this filled

## 2023-06-15 NOTE — Telephone Encounter (Signed)
Pt reports no issues at this time with medication or dosage. Pt has appt on Friday

## 2023-06-17 NOTE — Progress Notes (Deleted)
Established Patient Office Visit   Subjective:  Patient ID: Tina Mayer, female    DOB: 07-11-1986  Age: 37 y.o. MRN: [id number]  No chief complaint on file.   HPI Obesity: Patient is taking Wegovy for weight loss. She just received an increase to 2.4 mg per injection. She reports she has been tolerating the 1.7mg  per injection with no complications.   Vitamin D Deficiency: Patients previous vitamin D level ws 19.20 on 12/23/2022. Recommended to take prescription strength vitamin D 50,000IU weekly for 12 weeks, then was suppose to have a recheck.   ROS See HPI above     Objective:     There were no vitals taken for this visit. {Vitals History (Optional):23777}  Physical Exam  No results found for any visits on 06/19/23.  The ASCVD Risk score (Arnett DK, et al., 2019) failed to calculate for the following reasons:   The 2019 ASCVD risk score is only valid for ages 54 to 27    Assessment & Plan:  There are no diagnoses linked to this encounter. 1.Review Health Maintenance: -Pap smear:  -Tdap vaccine: -Influenza vaccine:  No follow-ups on file.   Zandra Abts, NP

## 2023-06-19 ENCOUNTER — Ambulatory Visit: Payer: BC Managed Care – PPO | Admitting: Family Medicine

## 2023-06-19 DIAGNOSIS — E559 Vitamin D deficiency, unspecified: Secondary | ICD-10-CM

## 2023-06-26 ENCOUNTER — Encounter: Payer: Self-pay | Admitting: Family Medicine

## 2023-06-26 ENCOUNTER — Ambulatory Visit: Payer: BC Managed Care – PPO | Admitting: Family Medicine

## 2023-06-26 ENCOUNTER — Ambulatory Visit (INDEPENDENT_AMBULATORY_CARE_PROVIDER_SITE_OTHER): Payer: BC Managed Care – PPO | Admitting: Family Medicine

## 2023-06-26 VITALS — BP 122/72 | HR 74 | Temp 97.8°F | Ht 66.0 in | Wt 240.5 lb

## 2023-06-26 DIAGNOSIS — E559 Vitamin D deficiency, unspecified: Secondary | ICD-10-CM | POA: Diagnosis not present

## 2023-06-26 DIAGNOSIS — F339 Major depressive disorder, recurrent, unspecified: Secondary | ICD-10-CM

## 2023-06-26 NOTE — Patient Instructions (Addendum)
-  Continue with Wegovy 2.4mg  weekly injections for weight loss. Recommend to continue with exercising on a regular basis and try to work on diet of decreasing carbohydrates, sweets, low fat, and nongreasy foods.  -Ordered vitamin D to re assess deficiency. Office will call with lab results and you will see them on MyChart.  -Placed a referral to GYN at your previous location/provider.Call office or send a MyChart if you do receive a phone call or MyChart message about an appointment in 2 weeks.  Also, you can try making an appointment on your own since it has been less than 3 years.  -Placed a referral to psychiatry for medication management. Call office or send a MyChart if you do receive a phone call or MyChart message about an appointment in 2 weeks.  -Follow up in 5 months.

## 2023-06-26 NOTE — Progress Notes (Signed)
Established Patient Office Visit   Subjective:  Patient ID: Tina Mayer, female    DOB: 1985-11-22  Age: 37 y.o. MRN: [id number]  Chief Complaint  Patient presents with   Follow-up    Pt is here today to F/U on medication Pt reports no concerns PT reports she has been at [facility] name of who she seen. Pt reports she was dx depression and was taking Prozac 20mg  , she states she is not going to this location and would like a referral for new Physiatrist.    HPI Obesity: Patient is taking Wegovy for weight loss. She just received an increase to 2.4 mg per injection. She reports she has been tolerating the 1.7mg  per injection with no complications. She is still exercising 2 days a week. Cardio and weight training. Diet has been general, not changed anything on her diet. She is tolerating the 2.4 mg dose fine with no complications. She has took her second injection.  Wt Readings from Last 3 Encounters:  06/26/23 240 lb 8 oz (109.1 kg)  06/02/23 241 lb 4.8 oz (109.5 kg)  03/02/23 248 lb 6.4 oz (112.7 kg)   Vitamin D Deficiency: Patients previous vitamin D level was 19.20 on 12/23/2022. Recommended to take prescription strength vitamin D 50,000IU weekly for 12 weeks, then was suppose to have a recheck.   Patient is requesting a referral to psychiatry for medication management for depression. Patient is currently on Fluoxetine 20mg  daily. She last seen [facility] about 1 month ago. She would like another referral.   ROS See HPI above     Objective:   BP 122/72   Pulse 74   Temp 97.8 F (36.6 C)   Ht 5\' 6"  (1.676 m)   Wt 240 lb 8 oz (109.1 kg)   SpO2 100%   BMI 38.82 kg/m    Physical Exam Vitals reviewed.  Constitutional:      General: She is not in acute distress.    Appearance: Normal appearance. She is obese. She is not ill-appearing, toxic-appearing or diaphoretic.  HENT:     Head: Normocephalic and atraumatic.  Eyes:     General:         Right eye: No discharge.        Left eye: No discharge.     Conjunctiva/sclera: Conjunctivae normal.  Cardiovascular:     Rate and Rhythm: Normal rate and regular rhythm.     Heart sounds: Normal heart sounds. No murmur heard.    No friction rub. No gallop.  Pulmonary:     Effort: Pulmonary effort is normal. No respiratory distress.     Breath sounds: Normal breath sounds.  Musculoskeletal:        General: Normal range of motion.     Right lower leg: No edema.     Left lower leg: No edema.  Skin:    General: Skin is warm and dry.  Neurological:     General: No focal deficit present.     Mental Status: She is alert and oriented to person, place, and time. Mental status is at baseline.  Psychiatric:        Mood and Affect: Mood normal.        Behavior: Behavior normal.        Thought Content: Thought content normal.        Judgment: Judgment normal.      Assessment & Plan:  Vitamin D deficiency -     VITAMIN D  25 Hydroxy (Vit-D Deficiency, Fractures)  Depression, recurrent (HCC) -     Ambulatory referral to Psychiatry   Return in about 5 months (around 11/24/2023) for chronic management:[facility] .  1.Review Health Maintenance: -Pap smear: Upcoming appointment with [facility]. Also, recommend her try making an appointment on her own since it has been less than 3 years.  -Tdap vaccine: Declines  -Influenza vaccine:Declines  2.Ordered vitamin D to re assess deficiency. Office will call with lab results and she will see MyChart.  3. Continue with FUXNAT 2.4mg  weekly injections for weight loss. Recommend to continue with exercising on a regular basis and try to work on diet of decreasing carbohydrates, sweets, low fat, and nongreasy foods. Follow up in 5 months. If there has not been any improvement, discussed about possible referral to [facility].  4.Placed a referral to psychiatry for medication management.  Zandra Abts, NP

## 2023-06-27 LAB — VITAMIN D 25 HYDROXY (VIT D DEFICIENCY, FRACTURES): Vit D, 25-Hydroxy: 45 ng/mL (ref 30–100)

## 2023-07-16 ENCOUNTER — Encounter: Payer: Self-pay | Admitting: Family Medicine

## 2023-07-17 MED ORDER — SEMAGLUTIDE-WEIGHT MANAGEMENT 2.4 MG/0.75ML ~~LOC~~ SOAJ
2.4000 mg | SUBCUTANEOUS | 0 refills | Status: DC
Start: 2023-07-17 — End: 2023-08-24

## 2023-08-24 ENCOUNTER — Other Ambulatory Visit: Payer: Self-pay | Admitting: Family Medicine

## 2023-08-24 MED ORDER — SEMAGLUTIDE-WEIGHT MANAGEMENT 2.4 MG/0.75ML ~~LOC~~ SOAJ: 2.4000 mg | SUBCUTANEOUS | 0 refills | Status: DC

## 2023-09-07 ENCOUNTER — Telehealth: Payer: Self-pay | Admitting: Family Medicine

## 2023-09-07 ENCOUNTER — Telehealth: Payer: Self-pay

## 2023-09-07 NOTE — Telephone Encounter (Signed)
Caller name: Torrance Decordova  On DPR?: Yes  Call back number: [phone] (mobile)  Provider they see: Alveria Apley, NP  Reason for call: Pt was following up on status of Wegovy refill request. It appears a refill was sent to pharmacy on 12/2 so pt was advised to call the pharmacy and ask if they can confirm they received the refill, inquire of the status and if a PA was required.

## 2023-09-07 NOTE — Telephone Encounter (Signed)
PA request has been Submitted. New Encounter created for follow up. For additional info see Pharmacy Prior Auth telephone encounter from 09/07/23.

## 2023-09-07 NOTE — Telephone Encounter (Signed)
Pharmacy Patient Advocate Encounter   Received notification from Pt Calls Messages that prior authorization for [facility] 2.4MG /0.75ML auto-injectors is required/requested.   Insurance verification completed.   The patient is insured through [facility] .   Per test claim: PA required; PA submitted to above mentioned insurance via CoverMyMeds Key/confirmation #/EOC  [id number] Status is pending

## 2023-09-08 ENCOUNTER — Other Ambulatory Visit (HOSPITAL_COMMUNITY): Payer: Self-pay

## 2023-09-08 NOTE — Telephone Encounter (Signed)
Pharmacy Patient Advocate Encounter  Received notification from [facility] that Prior Authorization for [facility] 2.4MG /0.75ML auto-injectors has been APPROVED from 09/07/23 to 09/06/24   PA #/Case ID/Reference #: [id number]

## 2023-09-11 ENCOUNTER — Other Ambulatory Visit: Payer: BC Managed Care – PPO

## 2023-09-11 ENCOUNTER — Inpatient Hospital Stay: Payer: BC Managed Care – PPO | Admitting: Nurse Practitioner

## 2023-09-11 ENCOUNTER — Inpatient Hospital Stay: Payer: BC Managed Care – PPO

## 2023-09-25 ENCOUNTER — Other Ambulatory Visit: Payer: BC Managed Care – PPO

## 2023-09-25 ENCOUNTER — Ambulatory Visit: Payer: BC Managed Care – PPO | Admitting: Nurse Practitioner

## 2023-10-16 ENCOUNTER — Inpatient Hospital Stay: Payer: BC Managed Care – PPO | Admitting: Nurse Practitioner

## 2023-10-16 ENCOUNTER — Inpatient Hospital Stay: Payer: BC Managed Care – PPO

## 2023-10-16 ENCOUNTER — Telehealth: Payer: Self-pay

## 2023-10-16 NOTE — Telephone Encounter (Signed)
Patient called on 10/16/23 and left voicemail that she would like to cancel her appointment on 10/16/23 for labs and office visit with Misty Stanley. She stated at this moment she did not wish to reschedule. Appointments were cancelled and Misty Stanley was notified.

## 2023-11-20 ENCOUNTER — Encounter: Payer: BC Managed Care – PPO | Admitting: Radiology

## 2023-11-27 ENCOUNTER — Ambulatory Visit: Payer: BC Managed Care – PPO | Admitting: Family Medicine

## 2023-12-22 ENCOUNTER — Encounter: Payer: Self-pay | Admitting: Radiology

## 2023-12-22 ENCOUNTER — Other Ambulatory Visit (HOSPITAL_COMMUNITY)
Admission: RE | Admit: 2023-12-22 | Discharge: 2023-12-22 | Disposition: A | Discharge status: Home / Self Care | Source: Ambulatory Visit | Attending: Radiology | Admitting: Radiology

## 2023-12-22 ENCOUNTER — Telehealth: Payer: Self-pay

## 2023-12-22 ENCOUNTER — Ambulatory Visit (INDEPENDENT_AMBULATORY_CARE_PROVIDER_SITE_OTHER): Payer: BC Managed Care – PPO | Admitting: Radiology

## 2023-12-22 VITALS — BP 122/76 | Ht 65.5 in | Wt 253.6 lb

## 2023-12-22 DIAGNOSIS — Z1331 Encounter for screening for depression: Secondary | ICD-10-CM | POA: Diagnosis not present

## 2023-12-22 DIAGNOSIS — Z01419 Encounter for gynecological examination (general) (routine) without abnormal findings: Secondary | ICD-10-CM | POA: Insufficient documentation

## 2023-12-22 DIAGNOSIS — N921 Excessive and frequent menstruation with irregular cycle: Secondary | ICD-10-CM | POA: Diagnosis not present

## 2023-12-22 MED ORDER — ZEPBOUND 2.5 MG/0.5ML ~~LOC~~ SOAJ: 2.5000 mg | SUBCUTANEOUS | 0 refills | Status: DC

## 2023-12-22 MED ORDER — TRANEXAMIC ACID 650 MG PO TABS: 1300.0000 mg | ORAL_TABLET | Freq: Three times a day (TID) | ORAL | 4 refills | Status: AC

## 2023-12-22 NOTE — Telephone Encounter (Signed)
 Prior authorization received from covermymeds for Zepbound.  PA initiated.  Patient notified. KEY: [id number] DX: E66.01

## 2023-12-22 NOTE — Progress Notes (Signed)
 Tina Mayer Jul 22, 1986 [id number]   History:  38 y.o. G3P3 presents for annual exam as a new patient. Hx of heavy, irregular periods with anemia. Had BTL, would like to avoid hormonal treatments. Anemia is managed by her PCP. Was on wegovy for weight loss and it stopped working, she has gained weight back since stopping 3 months ago. Interested in another option. Felt she did better on ozempic. She does go to the gym with her son. Works for [employer].   Gynecologic History Patient's last menstrual period was 11/23/2023 (approximate). Period Cycle (Days): 28 Period Duration (Days): 7 Period Pattern: Regular Menstrual Flow: Heavy Menstrual Control: Thin pad, Maxi pad, Tampon Dysmenorrhea: (!) Moderate Dysmenorrhea Symptoms: Cramping Contraception/Family planning: tubal ligation Sexually active: yes Last Pap: 2019. Results were: normal   Obstetric History OB History  Gravida Para Term Preterm AB Living  3 3    3   SAB IAB Ectopic Multiple Live Births      3    # Outcome Date GA Lbr Len/2nd Weight Sex Type Anes PTL Lv  3 Para           2 Para           1 Para                12/22/2023   11:11 AM 06/26/2023    2:55 PM 03/02/2023    2:39 PM  Depression screen PHQ 2/9  Decreased Interest 0 1 1  Down, Depressed, Hopeless 0 1 0  PHQ - 2 Score 0 2 1  Altered sleeping  1 3  Tired, decreased energy  1 3  Change in appetite  0 0  Feeling bad or failure about yourself   1 1  Trouble concentrating  0 0  Moving slowly or fidgety/restless  0 0  Suicidal thoughts  0 0  PHQ-9 Score  5 8  Difficult doing work/chores  Somewhat difficult Somewhat difficult     The following portions of the patient's history were reviewed and updated as appropriate: allergies, current medications, past family history, past medical history, past social history, past surgical history, and problem list.  Review of Systems  Constitutional: Negative.   Cardiovascular: Negative.   Gastrointestinal:   Negative for abdominal pain, blood in stool, constipation, nausea and vomiting.  Genitourinary: Negative.  Negative for dysuria, flank pain, frequency, hematuria and urgency.  Endo/Heme/Allergies:  Does not bruise/bleed easily.  Psychiatric/Behavioral: Negative.  Negative for depression, memory loss and substance abuse. The patient is not nervous/anxious and does not have insomnia.     Past medical history, past surgical history, family history and social history were all reviewed and documented in the EPIC chart.  Exam:  Vitals:   12/22/23 1110  BP: 122/76  Weight: 253 lb 9.6 oz (115 kg)  Height: 5' 5.5" (1.664 m)   Body mass index is 41.56 kg/m.  Physical Exam Vitals and nursing note reviewed. Exam conducted with a chaperone present.  Constitutional:      Appearance: Normal appearance. She is obese.  HENT:     Head: Normocephalic and atraumatic.  Neck:     Thyroid: No thyroid mass, thyromegaly or thyroid tenderness.  Cardiovascular:     Rate and Rhythm: Regular rhythm.     Heart sounds: Normal heart sounds.  Pulmonary:     Effort: Pulmonary effort is normal.     Breath sounds: Normal breath sounds.  Chest:  Breasts:    Breasts are symmetrical.  Right: Normal. No inverted nipple, mass, nipple discharge, skin change or tenderness.     Left: Normal. No inverted nipple, mass, nipple discharge, skin change or tenderness.  Abdominal:     General: Abdomen is flat. Bowel sounds are normal.     Palpations: Abdomen is soft.  Genitourinary:    General: Normal vulva.     Vagina: Normal. No vaginal discharge, bleeding or lesions.     Cervix: Normal. No discharge or lesion.     Uterus: Normal. Not enlarged and not tender.      Adnexa: Right adnexa normal and left adnexa normal.       Right: No mass, tenderness or fullness.         Left: No mass, tenderness or fullness.    Lymphadenopathy:     Upper Body:     Right upper body: No axillary adenopathy.     Left upper body: No  axillary adenopathy.  Skin:    General: Skin is warm and dry.  Neurological:     Mental Status: She is alert and oriented to person, place, and time.  Psychiatric:        Mood and Affect: Mood normal.        Thought Content: Thought content normal.        Judgment: Judgment normal.      [name], CMA present for exam  Starting weight: 253  First goal weight: 220 Goal weight: 190   Assessment/Plan:   1. Well woman exam with routine gynecological exam (Primary) - Cytology - PAP( [facility]) - Schedule mammogram, turns 38 in 2 months  2. Menorrhagia with irregular cycle Discussed treatment options would like to try Lysteda first - tranexamic acid (LYSTEDA) 650 MG TABS tablet; Take 2 tablets (1,300 mg total) by mouth 3 (three) times daily for 5 days.  Dispense: 30 tablet; Refill: 4  3. Severe obesity (BMI >= 40) (HCC) Will change to mounjaro Follow up 4 weeks for weight check - tirzepatide (ZEPBOUND) 2.5 MG/0.5ML Pen; Inject 2.5 mg into the skin once a week.  Dispense: 2 mL; Refill: 0    Discussed SBE, pap and mammogram screening as directed/appropriate. Recommend of exercise weekly, including weight bearing exercise.   Return in about 4 weeks (around 01/19/2024) for Med Follow-up.  Arlie Solomons B WHNP-BC 11:38 AM 12/22/2023

## 2023-12-23 NOTE — Telephone Encounter (Signed)
 PA for Zepbound approved.  Patient notified.

## 2023-12-24 LAB — CYTOLOGY - PAP
Adequacy: ABSENT
Comment: NEGATIVE
Diagnosis: NEGATIVE
High risk HPV: NEGATIVE

## 2024-01-21 ENCOUNTER — Encounter: Payer: Self-pay | Admitting: Radiology

## 2024-01-21 ENCOUNTER — Ambulatory Visit: Admitting: Radiology

## 2024-01-21 MED ORDER — ZEPBOUND 5 MG/0.5ML ~~LOC~~ SOAJ: 5.0000 mg | SUBCUTANEOUS | 0 refills | Status: DC

## 2024-01-21 NOTE — Progress Notes (Signed)
   Tina Mayer 1985/11/12 [id number]   History:  38 y.o. presents for weight management. Has done well on Zepbound  2.5mg , no side effects. Has gained 5lbs since starting. She is ready to increase to the next dose.  Gynecologic History Patient's last menstrual period was 12/24/2023 (approximate). Contraception/Family planning: tubal ligation Last Pap: 12/2023. Results were: normal   Obstetric History OB History  Gravida Para Term Preterm AB Living  3 3    3   SAB IAB Ectopic Multiple Live Births      3    # Outcome Date GA Lbr Len/2nd Weight Sex Type Anes PTL Lv  3 Para           2 Para           1 Para                12/22/2023   11:11 AM 06/26/2023    2:55 PM 03/02/2023    2:39 PM  Depression screen PHQ 2/9  Decreased Interest 0 1 1  Down, Depressed, Hopeless 0 1 0  PHQ - 2 Score 0 2 1  Altered sleeping  1 3  Tired, decreased energy  1 3  Change in appetite  0 0  Feeling bad or failure about yourself   1 1  Trouble concentrating  0 0  Moving slowly or fidgety/restless  0 0  Suicidal thoughts  0 0  PHQ-9 Score  5 8  Difficult doing work/chores  Somewhat difficult Somewhat difficult     The following portions of the patient's history were reviewed and updated as appropriate: allergies, current medications, past family history, past medical history, past social history, past surgical history, and problem list.  Review of Systems  All other systems reviewed and are negative.   Past medical history, past surgical history, family history and social history were all reviewed and documented in the EPIC chart.  Exam:  Vitals:   01/21/24 1439  BP: 118/74  Weight: 258 lb 6.4 oz (117.2 kg)   Body mass index is 42.35 kg/m.  Physical Exam Vitals and nursing note reviewed.  Constitutional:      Appearance: Normal appearance. She is obese.  Pulmonary:     Effort: Pulmonary effort is normal.  Neurological:     Mental Status: She is alert.  Psychiatric:         Mood and Affect: Mood normal.        Thought Content: Thought content normal.        Judgment: Judgment normal.      Ellis Guys, CMA present for exam  Starting weight: 253  Weight today:258 First goal weight: 220 Goal weight: 190  Assessment/Plan:   1. Severe obesity (BMI >= 40) (HCC) (Primary) - tirzepatide  (ZEPBOUND ) 5 MG/0.5ML Pen; Inject 5 mg into the skin once a week.  Dispense: 2 mL; Refill: 0    Risks and benefits discussed. Continue to focus on protein and fiber. Small frequent meals. Adequate hydration and electrolyte replacement. Will continue to exercise for at least a week including weight bearing exercise.   Return in about 4 weeks (around 02/18/2024) for Med Follow-up.  Synetta Eves B WHNP-BC 2:45 PM 01/21/2024

## 2024-02-02 DIAGNOSIS — R0981 Nasal congestion: Secondary | ICD-10-CM | POA: Diagnosis not present

## 2024-02-02 DIAGNOSIS — Z20822 Contact with and (suspected) exposure to covid-19: Secondary | ICD-10-CM | POA: Diagnosis not present

## 2024-02-02 DIAGNOSIS — R059 Cough, unspecified: Secondary | ICD-10-CM | POA: Diagnosis not present

## 2024-02-02 DIAGNOSIS — J069 Acute upper respiratory infection, unspecified: Secondary | ICD-10-CM | POA: Diagnosis not present

## 2024-02-25 ENCOUNTER — Ambulatory Visit: Admitting: Radiology

## 2024-03-08 ENCOUNTER — Encounter: Payer: Self-pay | Admitting: Radiology

## 2024-03-08 ENCOUNTER — Ambulatory Visit (INDEPENDENT_AMBULATORY_CARE_PROVIDER_SITE_OTHER): Admitting: Radiology

## 2024-03-08 MED ORDER — ZEPBOUND 10 MG/0.5ML ~~LOC~~ SOAJ: 10.0000 mg | SUBCUTANEOUS | 0 refills | Status: DC

## 2024-03-08 MED ORDER — ZEPBOUND 7.5 MG/0.5ML ~~LOC~~ SOAJ: 7.5000 mg | SUBCUTANEOUS | 0 refills | Status: DC

## 2024-03-08 NOTE — Progress Notes (Signed)
   Joniyah Mallinger 12-24-1985 [id number]   History:  38 y.o. presents for weight management. Has done well on Zepbound  5mg , no side effects. Has gained 6lbs since starting. She is ready to increase to the next dose. Ran out 2 weeks ago because her appt was changed by the office.  Gynecologic History Patient's last menstrual period was 02/22/2024 (exact date). Contraception/Family planning: tubal ligation Last Pap: 12/2023. Results were: normal   Obstetric History OB History  Gravida Para Term Preterm AB Living  3 3    3   SAB IAB Ectopic Multiple Live Births      3    # Outcome Date GA Lbr Len/2nd Weight Sex Type Anes PTL Lv  3 Para           2 Para           1 Para              The following portions of the patient's history were reviewed and updated as appropriate: allergies, current medications, past family history, past medical history, past social history, past surgical history, and problem list.  Review of Systems  All other systems reviewed and are negative.   Past medical history, past surgical history, family history and social history were all reviewed and documented in the EPIC chart.  Exam:  Vitals:   03/08/24 1501  BP: 124/82  Weight: 259 lb 12.8 oz (117.8 kg)   Body mass index is 42.58 kg/m.  Physical Exam Vitals and nursing note reviewed.  Constitutional:      Appearance: Normal appearance. She is obese.  Pulmonary:     Effort: Pulmonary effort is normal.   Neurological:     Mental Status: She is alert.   Psychiatric:        Mood and Affect: Mood normal.        Thought Content: Thought content normal.        Judgment: Judgment normal.      Starting weight: 253  Weight today:259 First goal weight: 220 Goal weight: 190  Assessment/Plan:   1. Severe obesity (BMI >= 40) (HCC) (Primary) - tirzepatide  (ZEPBOUND ) 7.5 MG/0.5ML Pen; Inject 7.5 mg into the skin once a week.  Dispense: 2 mL; Refill: 0 - tirzepatide  (ZEPBOUND ) 10 MG/0.5ML  Pen; Inject 10 mg into the skin once a week.  Dispense: 2 mL; Refill: 0    Risks and benefits discussed. Continue to focus on protein and fiber. Small frequent meals. Adequate hydration and electrolyte replacement. Will continue to exercise for at least a week including weight bearing exercise.   Return in about 8 weeks (around 05/03/2024) for Med Follow-up.  Synetta Eves B WHNP-BC 3:09 PM 03/08/2024

## 2024-03-30 ENCOUNTER — Other Ambulatory Visit: Payer: Self-pay | Admitting: Radiology

## 2024-03-30 MED ORDER — ZEPBOUND 10 MG/0.5ML ~~LOC~~ SOAJ
10.0000 mg | SUBCUTANEOUS | 0 refills | Status: DC
Start: 2024-04-01 — End: 2024-04-14

## 2024-04-12 ENCOUNTER — Other Ambulatory Visit: Payer: Self-pay | Admitting: Radiology

## 2024-04-13 ENCOUNTER — Other Ambulatory Visit: Payer: Self-pay | Admitting: Radiology

## 2024-04-13 ENCOUNTER — Telehealth: Payer: Self-pay

## 2024-04-13 MED ORDER — MOUNJARO 10 MG/0.5ML ~~LOC~~ SOAJ: 10.0000 mg | SUBCUTANEOUS | 0 refills | Status: DC

## 2024-04-13 NOTE — Telephone Encounter (Signed)
 Pharmacy note: Zepbound  10 mg not covered by insurance.  Prior authorization for Zepbound  was approved 12/22/23.  Tina Mayer (Key: [id number]) [phone] Zepbound  2.5MG /0.5ML pen-injectors status: PA Response - ApprovedCreated: April 1st, 2025 [phone]: April 1st, 2025

## 2024-04-13 NOTE — Telephone Encounter (Signed)
 Spoke to patient regarding prescription insurance benefits no longer covering Zepbound  and New RX for Mounjaro  was sent to her pharmacy. Patient verbalized understanding.

## 2024-04-13 NOTE — Telephone Encounter (Signed)
 Zepbound  10 mg not covered by patient's insurance.  Suggested alternative Wegovy  0.25 mg. Sent to provider for review.  Pharmacy comment: Alternative Requested:THE PRESCRIBED MEDICATION IS NOT COVERED BY INSURANCE. PLEASE CONSIDER CHANGING TO ONE OF THE SUGGESTED COVERED ALTERNATIVES.

## 2024-04-13 NOTE — Telephone Encounter (Signed)
 Prior authorization request received from covermymeds for Mounjaro  10 mg.  PA initiated.   KEY: [id number] DX: E66.01

## 2024-04-14 ENCOUNTER — Other Ambulatory Visit: Payer: Self-pay | Admitting: Radiology

## 2024-04-14 ENCOUNTER — Telehealth: Payer: Self-pay

## 2024-04-14 MED ORDER — ZEPBOUND 10 MG/0.5ML ~~LOC~~ SOAJ: 10.0000 mg | SUBCUTANEOUS | 0 refills | Status: DC

## 2024-04-14 NOTE — Telephone Encounter (Signed)
 Appeal sent for Zepbound . Prescription Claim Appeals-[facility] [id number] Appeal letter, records faxed to [phone]

## 2024-04-14 NOTE — Telephone Encounter (Signed)
 Zepbound  10 mg not covered by patient's insurance.  Prior authorization was denied and appeal has been submitted. RX change denied and sent to provider for review.  Pharmacy comment: Alternative Requested:THE PRESCRIBED MEDICATION IS NOT COVERED BY INSURANCE. PLEASE CONSIDER CHANGING TO ONE OF THE SUGGESTED COVERED ALTERNATIVES.

## 2024-04-14 NOTE — Telephone Encounter (Signed)
 Has tried an failed wegovy , can we appeal her zepbound  denial?

## 2024-04-14 NOTE — Telephone Encounter (Signed)
 Prior authorization for Mounjaro  10 mg was denied. Why your request was denied: Your plan only covers this drug when it is used for certain health conditions. Covered use is for type 2 diabetes mellitus. Your plan does not cover the drug for your health condition that your doctor told us  you have.  Sent to provider for review.

## 2024-04-14 NOTE — Telephone Encounter (Signed)
 Appeal previously sent for Mounjaro  (not Zepbound ).  Jami sent new RX for Zepbound . Prior authorization request received from covermymeds for Zepbound  10 mg. KEY: [id number] DX: Z33.98

## 2024-04-14 NOTE — Telephone Encounter (Signed)
 Opened in error

## 2024-04-15 ENCOUNTER — Other Ambulatory Visit: Payer: Self-pay | Admitting: Radiology

## 2024-04-15 NOTE — Telephone Encounter (Signed)
 Appeal sent for Zepbound  10 mg.   RX denied.

## 2024-04-15 NOTE — Telephone Encounter (Signed)
 Appeal initiated for Zepbound  through Prescription Claim Appeals [facility] - [facility].  Appeal Letter, medical records faxed to [phone].  PA #[id number]

## 2024-04-15 NOTE — Telephone Encounter (Signed)
 Appeal for Zepbound  10 mg was denied: Why your request was denied: Your plan does not cover this drug. Sent to provider for review.

## 2024-04-15 NOTE — Telephone Encounter (Signed)
 Prior authorization for Zepbound  10 mg was denied. Why your request was denied: Your plan does not cover this drug. Sent to provider for review.

## 2024-04-19 NOTE — Telephone Encounter (Signed)
 2 additional appeal forms received from [facility] for the Mounjaro  (old PA request).  Forms not completed as Zepbound  appeal is in process.

## 2024-04-20 NOTE — Telephone Encounter (Signed)
 Received prior authorization appeal approval from [facility] for Mounjaro  (PA previously denied and appeal was initiated).  Mounjaro  request is approved from 04/20/24-12/19/24.  Patient asked to be changed to Zepbound  10 mg 04/14/24.  PA for Zepbound  10 mg was denied and appeal was started for Zepbound  10 mg 04/15/24.  Sent to provider for review and recommendations.

## 2024-04-21 NOTE — Telephone Encounter (Signed)
 Please notify patient and have her schedule follow up in 4 weeks for weight check and dose increase.

## 2024-04-21 NOTE — Telephone Encounter (Signed)
 LM on VM (ok per DPR) Mounjaro  prior authorization appeal was approved.  Patient was advised to contact her pharmacy to pick up prescription and to please call our office to schedule 4 week follow up.  Patient was instructed to call back if she had any questions.  MyChart message was sent to patient as well.

## 2024-04-22 ENCOUNTER — Telehealth: Payer: Self-pay

## 2024-04-22 NOTE — Telephone Encounter (Signed)
 Patient sent message that her Mounjaro  prescription was not ready at her pharmacy (prior authorization appeal was approved).  Mounjaro  prescription was cancelled and changed to Zepbound .  New Prescription needs to be sent to pharmacy for Mounjaro  (unsure if this will generate another prior authorization).  Sent to provider for review.

## 2024-05-06 ENCOUNTER — Ambulatory Visit: Admitting: Radiology

## 2024-05-18 ENCOUNTER — Encounter: Payer: Self-pay | Admitting: Radiology

## 2024-05-18 ENCOUNTER — Ambulatory Visit: Admitting: Radiology

## 2024-05-18 MED ORDER — MOUNJARO 12.5 MG/0.5ML ~~LOC~~ SOAJ: 12.5000 mg | SUBCUTANEOUS | 1 refills | Status: DC

## 2024-05-18 MED ORDER — MOUNJARO 15 MG/0.5ML ~~LOC~~ SOAJ: 15.0000 mg | SUBCUTANEOUS | 1 refills | Status: DC

## 2024-05-18 NOTE — Progress Notes (Signed)
   Tina Mayer 1986/08/07 [id number]   History:  38 y.o. presents for weight management. Has done well on Mounjaro  10mg , no side effects. Last lost 10lsb since starting. She is ready to increase to the next dose.  Gynecologic History Patient's last menstrual period was 04/26/2024 (exact date). Contraception/Family planning: tubal ligation Last Pap: 12/2023. Results were: normal   Obstetric History OB History  Gravida Para Term Preterm AB Living  3 3    3   SAB IAB Ectopic Multiple Live Births      3    # Outcome Date GA Lbr Len/2nd Weight Sex Type Anes PTL Lv  3 Para           2 Para           1 Para              The following portions of the patient's history were reviewed and updated as appropriate: allergies, current medications, past family history, past medical history, past social history, past surgical history, and problem list.  Review of Systems  All other systems reviewed and are negative.   Past medical history, past surgical history, family history and social history were all reviewed and documented in the EPIC chart.  Exam:  Vitals:   05/18/24 1112  BP: 108/70  Weight: 249 lb (112.9 kg)   Body mass index is 40.81 kg/m.  Physical Exam Vitals and nursing note reviewed.  Constitutional:      Appearance: Normal appearance. She is obese.  Pulmonary:     Effort: Pulmonary effort is normal.  Neurological:     Mental Status: She is alert.  Psychiatric:        Mood and Affect: Mood normal.        Thought Content: Thought content normal.        Judgment: Judgment normal.      Starting weight: 250 Weight today:249 First goal weight: 220 Goal weight: 190  Assessment/Plan:   1. Severe obesity (BMI >= 40) (HCC) (Primary) - tirzepatide  (MOUNJARO ) 12.5 MG/0.5ML Pen; Inject 12.5 mg into the skin once a week.  Dispense: 2 mL; Refill: 1 - tirzepatide  (MOUNJARO ) 15 MG/0.5ML Pen; Inject 15 mg into the skin once a week.  Dispense: 2 mL; Refill: 1     Risks and benefits discussed. Patient will focus on protein and fiber. Small frequent meals. Adequate hydration and electrolyte replacement. Will continue to exercise for at least a week including weight bearing exercise.   Return in about 3 months (around 08/18/2024) for Med Follow-up.  GINETTE COZIER B WHNP-BC 11:29 AM 05/18/2024

## 2024-06-04 DIAGNOSIS — N898 Other specified noninflammatory disorders of vagina: Secondary | ICD-10-CM | POA: Diagnosis not present

## 2024-06-04 DIAGNOSIS — Z3202 Encounter for pregnancy test, result negative: Secondary | ICD-10-CM | POA: Diagnosis not present

## 2024-07-01 MED ORDER — METRONIDAZOLE 500 MG PO TABS: 500.0000 mg | ORAL_TABLET | Freq: Two times a day (BID) | ORAL | 0 refills | Status: DC

## 2024-07-01 NOTE — Telephone Encounter (Signed)
 Flagyl bid x 7 days sent to her pharmacy. Message sent to mychart letting her know.

## 2024-07-01 NOTE — Telephone Encounter (Signed)
 Ok to resend flagyl #14 and make sure her partner was treated and they are abstaining.

## 2024-08-10 ENCOUNTER — Other Ambulatory Visit: Payer: Self-pay | Admitting: Nurse Practitioner

## 2024-08-10 MED ORDER — MOUNJARO 12.5 MG/0.5ML ~~LOC~~ SOAJ: 12.5000 mg | SUBCUTANEOUS | 1 refills | Status: DC

## 2024-08-12 ENCOUNTER — Ambulatory Visit: Admitting: Radiology

## 2024-09-26 ENCOUNTER — Ambulatory Visit: Admitting: Family Medicine

## 2024-09-26 ENCOUNTER — Encounter: Payer: Self-pay | Admitting: Family Medicine

## 2024-09-26 VITALS — BP 118/76 | HR 76 | Temp 98.4°F | Ht 65.5 in | Wt 248.0 lb

## 2024-09-26 DIAGNOSIS — M79641 Pain in right hand: Secondary | ICD-10-CM

## 2024-09-26 DIAGNOSIS — Z79899 Other long term (current) drug therapy: Secondary | ICD-10-CM

## 2024-09-26 DIAGNOSIS — R200 Anesthesia of skin: Secondary | ICD-10-CM

## 2024-09-26 DIAGNOSIS — R202 Paresthesia of skin: Secondary | ICD-10-CM | POA: Diagnosis not present

## 2024-09-26 LAB — COMPREHENSIVE METABOLIC PANEL WITH GFR
ALT: 13 U/L (ref 3–35)
AST: 14 U/L (ref 5–37)
Albumin: 4.1 g/dL (ref 3.5–5.2)
Alkaline Phosphatase: 68 U/L (ref 39–117)
BUN: 11 mg/dL (ref 6–23)
CO2: 26 meq/L (ref 19–32)
Calcium: 9.3 mg/dL (ref 8.4–10.5)
Chloride: 103 meq/L (ref 96–112)
Creatinine, Ser: 0.72 mg/dL (ref 0.40–1.20)
GFR: 104.51 mL/min
Glucose, Bld: 94 mg/dL (ref 70–99)
Potassium: 4 meq/L (ref 3.5–5.1)
Sodium: 138 meq/L (ref 135–145)
Total Bilirubin: 0.6 mg/dL (ref 0.2–1.2)
Total Protein: 7.8 g/dL (ref 6.0–8.3)

## 2024-09-26 MED ORDER — MELOXICAM 7.5 MG PO TABS: 7.5000 mg | ORAL_TABLET | Freq: Two times a day (BID) | ORAL | 0 refills | Status: AC

## 2024-09-26 NOTE — Patient Instructions (Addendum)
-  It was a pleasure to care for you today.  -Prescribed Meloxicam  7.5mg  tablet to take daily for pain. May increase to taking 1 tablet twice a day if needed instead of once a day. Recommend to eat a snack when taking medication and do not take other NSAIDS (Ibuprofen , Advil , Aleve, or Naproxen) while taking medication. -Placed a referral to orthopedic for right hand with concern with numbness and tingling. Please call the office or send a MyChart message if you do not receive a phone call or a MyChart message about appointment in 2 weeks.  -Ordered CMP to assess kidney function with starting Meloxicam .  -May try wearing a thumb spica splint to help with numbness and tingling with typing a lot while working.

## 2024-09-26 NOTE — Progress Notes (Signed)
 "  Acute Office Visit   Subjective:  Patient ID: Tina Mayer, female    DOB: 1986/08/13, 39 y.o.   MRN: [id number]  Chief Complaint  Patient presents with   Hand Pain    Right hand pain for two months     HPI:  Patient is having right hand pain that started about 2 months ago. She reports she was holding her dog leash in that hand when her dog suddenly ran out the door, her right hand hit the door frame on the medial side. Initially she had severe swelling and pain. Now, her right index finger will randomly extend without being able to bend, has burning, sharp pain intermittent pain, and intermittent numbness that has progressed more with being constant. Pain is mostly location in anterior, medial side of the hand. Numbness is mostly were in the anterior middle hand that radiates up to her anterior right arm.   She reports she has took Tylenol  for her symptoms with some relief.  ROS See HPI above      Objective:   BP 118/76   Pulse 76   Temp 98.4 F (36.9 C) (Oral)   Ht 5' 5.5 (1.664 m)   Wt 248 lb (112.5 kg)   SpO2 98%   BMI 40.64 kg/m      Physical Exam Vitals reviewed.  Constitutional:      General: She is not in acute distress.    Appearance: Normal appearance. She is not ill-appearing, toxic-appearing or diaphoretic.  Eyes:     General:        Right eye: No discharge.        Left eye: No discharge.     Conjunctiva/sclera: Conjunctivae normal.  Cardiovascular:     Rate and Rhythm: Normal rate.  Pulmonary:     Effort: Pulmonary effort is normal. No respiratory distress.  Musculoskeletal:        General: Normal range of motion.     Left hand: Tenderness present. No swelling or deformity. Normal range of motion. Normal pulse.  Skin:    General: Skin is warm and dry.  Neurological:     General: No focal deficit present.     Mental Status: She is alert and oriented to person, place, and time. Mental status is at baseline.  Psychiatric:        Mood  and Affect: Mood normal.        Behavior: Behavior normal.        Thought Content: Thought content normal.        Judgment: Judgment normal.       Assessment & Plan:  Right hand pain -     Meloxicam ; Take 1 tablet (7.5 mg total) by mouth in the morning and at bedtime.  Dispense: 60 tablet; Refill: 0 -     Ambulatory referral to Orthopedics  Numbness and tingling in right hand -     Ambulatory referral to Orthopedics  Medication management -     Comprehensive metabolic panel with GFR  -Prescribed Meloxicam  7.5mg  tablet to take daily for pain. May increase to taking 1 tablet twice a day if needed instead of once a day. Recommend to eat a snack when taking medication and do not take other NSAIDS (Ibuprofen , Advil , Aleve, or Naproxen) while taking medication. Discussed about other options: Prednisone , Gabapentin . Decided on Meloxicam .  -Placed a referral to orthopedic for right hand with concern with numbness and tingling.  -X-ray was not available in office today and it  may be quicker for patient to see orthopedic before having an x-ray completed.  -Ordered CMP to assess kidney function with starting Meloxicam .  -Recommend to try wearing a thumb spica splint to help with numbness and tingling with typing a lot while working.   Damita Eppard, NP "

## 2024-09-26 NOTE — Progress Notes (Signed)
 "  Tina Mayer - 39 y.o. female MRN [id number]  Date of birth: 11-Sep-1986  Office Visit Note: Visit Date: 09/27/2024 PCP: Billy Philippe SAUNDERS, NP Referred by: Billy Philippe SAUNDERS, NP  Subjective: No chief complaint on file.  HPI: Tina Mayer is a pleasant 39 y.o. female who presents today for evaluation of right hand pain with intermittent numbness and tingling.  She did have a traumatic injury approximately 2 months prior in which her hand was impacted along her door frame while holding a dog leash.  Has had some ongoing stiffness and pain along the right index finger region that has been persistent.  She does do excessive typing at work which she feels is contributing to her ongoing symptoms as well.  Describes some intermittent numbness and tingling with associated nocturnal symptoms as well.  Pertinent ROS were reviewed with the patient and found to be negative unless otherwise specified above in HPI.   Visit Reason: right hand Duration of symptoms: 2months Hand dominance: right Occupation: typing Diabetic: No Smoking: No Heart/Lung History: none Blood Thinners: none  Prior Testing/EMG: none Injections (Date): none Treatments: tylenol  Prior Surgery: none    Assessment & Plan: Visit Diagnoses:  1. 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 Pain in right hand     Plan: Based on examination today, she has an element of right-sided carpal tunnel syndrome with associated stenosing tenosynovitis of the right index finger.  We discussed the etiology and pathophysiology of both of these diagnoses.  Given that she has not undergone any formalized treatment, we will begin with conservative measures.  I discussed with her activity modification, ergonomic positioning for the wrist and digits while at work, wrist bracing particular for nocturnal symptoms, nonsteroidal anti-inflammatory medication with topical and oral and finally cortisone injection.   Given that she is localizing the pain mostly to the index finger today, we will perform injection of cortisone to the right index A1 pulley region for the ongoing stenosing tenosynovitis.  I will also send a referral for occupational therapy to work with range of motion for tendon glides and nerve glides of the right wrist and hand, transition to home exercise program when appropriate.  She can follow-up with me in approximately 6 weeks for recheck.  I did discuss with her the possibility of carpal tunnel release versus potential trigger digit release in the future should symptoms remain refractory to conservative care.  Follow-up: No follow-ups on file.   Meds & Orders: No orders of the defined types were placed in this encounter.   Orders Placed This Encounter  Procedures   Hand/UE Inj   XR Hand Complete Right     Procedures: Hand/UE Inj: R index A1 for trigger finger on 09/27/2024 8:25 AM Indications: pain Details: 25 G needle, volar approach Medications: 1 mL lidocaine  1 %; 6 mg betamethasone  acetate-betamethasone  sodium phosphate 6 (3-3) MG/ML Outcome: tolerated well, no immediate complications Procedure, treatment alternatives, risks and benefits explained, specific risks discussed. Consent was given by the patient. Patient was prepped and draped in the usual sterile fashion.          Clinical History: No specialty comments available.  She reports that she has never smoked. She has never been exposed to tobacco smoke. She has never used smokeless tobacco. No results for input(s): HGBA1C, LABURIC in the last 8760 hours.  Objective:   Vital Signs: There were no vitals taken for this visit.  Physical Exam  Gen: Well-appearing, in no  acute distress; non-toxic CV: Regular Rate. Well-perfused. Warm.  Resp: Breathing unlabored on room air; no wheezing. Psych: Fluid speech in conversation; appropriate affect; normal thought process  Ortho Exam PHYSICAL  EXAM:  General: Patient is well appearing and in no distress.   Skin and Muscle: No significant skin changes are apparent to upper extremities.   Range of Motion and Palpation Tests: Mobility is full about the elbows with flexion and extension. Forearm supination and pronation are 85/85 bilaterally.  Wrist flexion/extension is 75/65 bilaterally.  Digital flexion and extension are full.  Thumb opposition is full to the base of the small fingers bilaterally.    Palpable nodule right index A1 pulley with associated tenderness.  Minimal triggering is observed.     Neurologic, Vascular, Motor: Sensation is intact to light touch in the bilateral median nerve distribution.    Thenar atrophy: Negative Tinel sign: Mildly positive right carpal tunnel Carpal tunnel compression: Negative Phalen test: Positive right   Motor right hand FPL: 5/5 Index FDP: 5/5 APB: 5/5  Fingers pink and well perfused.  Capillary refill is brisk.     Lab Results  Component Value Date   HGBA1C 5.4 12/11/2021      Imaging: XR Hand Complete Right Result Date: 09/27/2024 There is no evidence of fracture or dislocation. There is no evidence of arthropathy or other focal bone abnormality. Soft tissues are unremarkable.    Past Medical/Family/Surgical/Social History: Medications & Allergies reviewed per EMR, new medications updated. Patient Active Problem List   Diagnosis Date Noted   Class 3 severe obesity without serious comorbidity with body mass index (BMI) of 40.0 to 44.9 in adult [facility]) 12/23/2022   Mild intermittent asthma with acute exacerbation 12/23/2022   Past Medical History:  Diagnosis Date   Anemia    Asthma    as a child   Depression    Vitamin D  deficiency    Family History  Problem Relation Age of Onset   Hypertension Mother    Testicular cancer Father    Diabetes Maternal Grandmother    Hypertension Maternal Grandmother    Past Surgical History:  Procedure Laterality Date    CESAREAN SECTION     CHOLECYSTECTOMY     TUBAL LIGATION     Social History   Occupational History   Not on file  Tobacco Use   Smoking status: Never    Passive exposure: Never   Smokeless tobacco: Never  Vaping Use   Vaping status: Never Used  Substance and Sexual Activity   Alcohol use: Yes    Comment: Once a month   Drug use: Never   Sexual activity: Yes    Partners: Male    Birth control/protection: Surgical    Comment: tubal, menarche 39yo, sexual debut 39yo    Kamaiyah Uselton Estela) Arlinda, M.D. [facility], Hand Surgery  "

## 2024-09-27 ENCOUNTER — Ambulatory Visit: Payer: Self-pay | Admitting: Family Medicine

## 2024-09-27 ENCOUNTER — Other Ambulatory Visit: Payer: Self-pay

## 2024-09-27 ENCOUNTER — Ambulatory Visit (INDEPENDENT_AMBULATORY_CARE_PROVIDER_SITE_OTHER): Admitting: Orthopedic Surgery

## 2024-09-27 DIAGNOSIS — M65841 Other synovitis and tenosynovitis, right hand: Secondary | ICD-10-CM | POA: Diagnosis not present

## 2024-09-27 DIAGNOSIS — M79641 Pain in right hand: Secondary | ICD-10-CM | POA: Diagnosis not present

## 2024-09-27 MED ORDER — LIDOCAINE HCL 1 % IJ SOLN
1.0000 mL | INTRAMUSCULAR | Status: AC | PRN
  Administered 2024-09-27: 1 mL

## 2024-09-27 MED ORDER — BETAMETHASONE SOD PHOS & ACET 6 (3-3) MG/ML IJ SUSP
6.0000 mg | INTRAMUSCULAR | Status: AC | PRN
  Administered 2024-09-27: 6 mg via INTRA_ARTICULAR

## 2024-09-27 NOTE — Addendum Note (Signed)
 Addended by: Genoa Freyre G on: 09/27/2024 08:46 AM   Modules accepted: Orders

## 2024-11-08 ENCOUNTER — Ambulatory Visit: Admitting: Orthopedic Surgery
